# Patient Record
Sex: Female | Born: 1980 | Race: White | Hispanic: No | Marital: Single | Smoking: Current every day smoker
Health system: Southern US, Community
[De-identification: ages and names within clinical notes are randomized; demographics above are authoritative.]

## PROBLEM LIST (undated history)

## (undated) DIAGNOSIS — O139 Gestational [pregnancy-induced] hypertension without significant proteinuria, unspecified trimester: Secondary | ICD-10-CM

## (undated) DIAGNOSIS — M549 Dorsalgia, unspecified: Secondary | ICD-10-CM

## (undated) DIAGNOSIS — I2699 Other pulmonary embolism without acute cor pulmonale: Secondary | ICD-10-CM

## (undated) DIAGNOSIS — F419 Anxiety disorder, unspecified: Secondary | ICD-10-CM

## (undated) DIAGNOSIS — D219 Benign neoplasm of connective and other soft tissue, unspecified: Secondary | ICD-10-CM

## (undated) HISTORY — PX: DILATION AND CURETTAGE OF UTERUS: SHX78

## (undated) HISTORY — PX: TONSILLECTOMY: SUR1361

## (undated) HISTORY — DX: Gestational (pregnancy-induced) hypertension without significant proteinuria, unspecified trimester: O13.9

## (undated) HISTORY — DX: Benign neoplasm of connective and other soft tissue, unspecified: D21.9

---
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
## 2001-08-07 ENCOUNTER — Other Ambulatory Visit: Admission: RE | Admit: 2001-08-07 | Discharge: 2001-08-07 | Payer: Self-pay | Admitting: Gynecology

## 2002-02-24 ENCOUNTER — Emergency Department (HOSPITAL_COMMUNITY): Admission: EM | Admit: 2002-02-24 | Discharge: 2002-02-24 | Payer: Self-pay

## 2002-02-24 ENCOUNTER — Encounter: Payer: Self-pay | Admitting: Emergency Medicine

## 2002-03-12 ENCOUNTER — Encounter: Admission: RE | Admit: 2002-03-12 | Discharge: 2002-03-12 | Payer: Self-pay | Admitting: Gynecology

## 2002-03-12 ENCOUNTER — Encounter: Payer: Self-pay | Admitting: Gynecology

## 2002-04-13 ENCOUNTER — Other Ambulatory Visit: Admission: RE | Admit: 2002-04-13 | Discharge: 2002-04-13 | Payer: Self-pay | Admitting: Gynecology

## 2002-04-24 ENCOUNTER — Encounter (INDEPENDENT_AMBULATORY_CARE_PROVIDER_SITE_OTHER): Payer: Self-pay

## 2002-04-24 ENCOUNTER — Ambulatory Visit (HOSPITAL_COMMUNITY): Admission: AD | Admit: 2002-04-24 | Discharge: 2002-04-24 | Payer: Self-pay | Admitting: Gynecology

## 2002-09-14 ENCOUNTER — Other Ambulatory Visit: Admission: RE | Admit: 2002-09-14 | Discharge: 2002-09-14 | Payer: Self-pay | Admitting: Gynecology

## 2004-12-19 ENCOUNTER — Other Ambulatory Visit: Admission: RE | Admit: 2004-12-19 | Discharge: 2004-12-19 | Payer: Self-pay | Admitting: Gynecology

## 2005-05-27 ENCOUNTER — Inpatient Hospital Stay (HOSPITAL_COMMUNITY): Admission: AD | Admit: 2005-05-27 | Discharge: 2005-05-27 | Payer: Self-pay | Admitting: Gynecology

## 2005-07-11 ENCOUNTER — Inpatient Hospital Stay (HOSPITAL_COMMUNITY): Admission: AD | Admit: 2005-07-11 | Discharge: 2005-07-14 | Payer: Self-pay | Admitting: Gynecology

## 2005-08-27 ENCOUNTER — Other Ambulatory Visit: Admission: RE | Admit: 2005-08-27 | Discharge: 2005-08-27 | Payer: Self-pay | Admitting: Gynecology

## 2005-11-06 ENCOUNTER — Ambulatory Visit: Payer: Self-pay | Admitting: Internal Medicine

## 2008-07-30 ENCOUNTER — Emergency Department (HOSPITAL_COMMUNITY): Admission: EM | Admit: 2008-07-30 | Discharge: 2008-07-30 | Payer: Self-pay | Admitting: Emergency Medicine

## 2008-11-02 ENCOUNTER — Other Ambulatory Visit: Admission: RE | Admit: 2008-11-02 | Discharge: 2008-11-02 | Payer: Self-pay | Admitting: Gynecology

## 2008-11-02 ENCOUNTER — Encounter: Payer: Self-pay | Admitting: Gynecology

## 2008-11-02 ENCOUNTER — Ambulatory Visit: Payer: Self-pay | Admitting: Gynecology

## 2008-11-12 ENCOUNTER — Ambulatory Visit: Payer: Self-pay | Admitting: Gynecology

## 2009-01-03 ENCOUNTER — Ambulatory Visit: Payer: Self-pay | Admitting: Gynecology

## 2009-01-07 ENCOUNTER — Ambulatory Visit: Payer: Self-pay | Admitting: Gynecology

## 2009-01-13 ENCOUNTER — Ambulatory Visit: Payer: Self-pay | Admitting: Gynecology

## 2009-01-24 ENCOUNTER — Ambulatory Visit: Payer: Self-pay | Admitting: Gynecology

## 2009-01-25 ENCOUNTER — Encounter: Payer: Self-pay | Admitting: Gynecology

## 2009-01-25 ENCOUNTER — Ambulatory Visit (HOSPITAL_BASED_OUTPATIENT_CLINIC_OR_DEPARTMENT_OTHER): Admission: RE | Admit: 2009-01-25 | Discharge: 2009-01-25 | Payer: Self-pay | Admitting: Gynecology

## 2009-05-20 ENCOUNTER — Ambulatory Visit: Payer: Self-pay | Admitting: Gynecology

## 2009-05-21 ENCOUNTER — Encounter: Payer: Self-pay | Admitting: Gynecology

## 2009-05-21 ENCOUNTER — Inpatient Hospital Stay (HOSPITAL_COMMUNITY): Admission: AD | Admit: 2009-05-21 | Discharge: 2009-05-21 | Payer: Self-pay | Admitting: Gynecology

## 2009-05-23 ENCOUNTER — Ambulatory Visit: Payer: Self-pay | Admitting: Gynecology

## 2010-05-12 ENCOUNTER — Emergency Department (HOSPITAL_COMMUNITY): Admission: EM | Admit: 2010-05-12 | Discharge: 2010-05-12 | Payer: Self-pay | Admitting: Emergency Medicine

## 2011-02-23 ENCOUNTER — Ambulatory Visit (INDEPENDENT_AMBULATORY_CARE_PROVIDER_SITE_OTHER): Payer: Medicaid Other | Admitting: Gynecology

## 2011-02-23 DIAGNOSIS — N912 Amenorrhea, unspecified: Secondary | ICD-10-CM

## 2011-03-02 ENCOUNTER — Other Ambulatory Visit: Payer: Medicaid Other

## 2011-03-02 ENCOUNTER — Ambulatory Visit (INDEPENDENT_AMBULATORY_CARE_PROVIDER_SITE_OTHER): Payer: Medicaid Other | Admitting: Gynecology

## 2011-03-02 DIAGNOSIS — N912 Amenorrhea, unspecified: Secondary | ICD-10-CM

## 2011-03-02 DIAGNOSIS — O26849 Uterine size-date discrepancy, unspecified trimester: Secondary | ICD-10-CM

## 2011-03-20 LAB — ANTIBODY SCREEN: Antibody Screen: NEGATIVE

## 2011-03-20 LAB — RUBELLA ANTIBODY, IGM: Rubella: IMMUNE

## 2011-03-20 LAB — RPR: RPR: NONREACTIVE

## 2011-03-20 LAB — ABO/RH: RH Type: POSITIVE

## 2011-03-20 LAB — HIV ANTIBODY (ROUTINE TESTING W REFLEX): HIV: NONREACTIVE

## 2011-04-16 LAB — POCT HEMOGLOBIN-HEMACUE: Hemoglobin: 13.5 g/dL (ref 12.0–15.0)

## 2011-05-15 NOTE — Op Note (Signed)
Peggy, [name]             ACCOUNT NO.:  [id number]   MEDICAL RECORD NO.:  [id number]          PATIENT TYPE:  AMB   LOCATION:  [facility]                         FACILITY:  [facility]   PHYSICIAN:  Juan H. Lily Peer, M.D.DATE OF BIRTH:  [birth year]   DATE OF PROCEDURE:  DATE OF DISCHARGE:                               OPERATIVE REPORT   INDICATIONS FOR OPERATION:  A 30 year old gravida 3, para 1, now AB 2  with a first trimester missed AB.  She had been following in the office  with serial ultrasound and quantitative beta hCG.  She had appropriate  rise in a quantitative beta hCG, but ultrasound demonstrated gestational  sac with no evidence of cardiac activity and a subchorionic hematoma had  been noted.   PREOPERATIVE DIAGNOSIS:  First trimester missed abortion.   POSTOPERATIVE DIAGNOSIS:  First trimester missed abortion.   PROCEDURE PERFORMED:  Dilatation and evacuation.   ANESTHESIA:  General endotracheal anesthesia.   FINDINGS:  Products of conception.   DESCRIPTION OF OPERATION:  After the patient was adequately counseled,  she was taken to the operating room.  She underwent a successful general  endotracheal anesthesia.  The vagina and perineum were prepped and  draped in the usual sterile fashion.  A Red Rubber Roxan Hockey had been  inserted to evacuate the bladder of its contents were approximately 50  cc.  The single-tooth tenaculum was placed on the anterior cervical lip.  The uterus sounded to approximately 8 cm and required minimal dilatation  with Shawnie Pons dilators to facilitate insertion of 10 mm suction curette.  This was introduced into the intrauterine cavity and the uterine cavity  was evacuated with suction unit followed by Hunter curette to completely  evacuate the intrauterine cavity of its products of conception present.  Uterotonic agent, Methergine 0.2 mg IM was administered and Tazocin was  administered and 500 cc of lactated Ringer's.  The single-tooth  tenaculum was removed.  The patient tolerated procedure well, was  extubated and transferred to recovery with stable vital signs.  She did  receive a gram of Cefotan for prophylaxis.  Blood loss was approximately  75 cc and less.  IV fluid 800 cc of lactated Ringer's.  Her blood type  is O+.      Juan H. Lily Peer, M.D.  Electronically Signed     JHF/MEDQ  D:  01/25/2009  T:  01/25/2009  Job:  [id number]

## 2011-05-15 NOTE — H&P (Signed)
Peggy [name], GLANDON             ACCOUNT NO.:  [id number]   MEDICAL RECORD NO.:  [id number]          PATIENT TYPE:  AMB   LOCATION:  N[facility]                         FACILITY:  [facility]   PHYSICIAN:  Juan H. Lily Peer, M.D.DATE OF BIRTH:  21-Apr-1981   DATE OF ADMISSION:  12/24/2008  DATE OF DISCHARGE:  12/24/2008                              HISTORY & PHYSICAL    The patient is scheduled for surgery on Tuesday, January 26th at 08:15  a.m. at [facility], please have history and physical  available.   CHIEF COMPLAINT:  First trimester missed AB.   HISTORY OF PRESENT ILLNESS:  The patient is a 30 year old gravida 3,  para 1, now AB 2 who has been followed in the office since January 4th.  The patient has had a last menstrual period reported to be in November  17th.  She was found to have a positive pregnancy test, was followed  with quantitative beta hCG's on January 4th with a value of 14,051.  On  January 07, 2009, it went up to 80,679.  She denied any vaginal bleeding  with followup with serial ultrasounds, the size was less than dates,  gestational sac had been seen, but no evidence of cardiac activity.  In  the last ultrasound done today on January 25th, had demonstrated by  ultrasound, she will be 6 weeks and 1 day by last menstrual period, 9  weeks and 6 days.  Uterine pregnancy was seen in the fundus size less  than date.  Fetal pole was seen.  No cardiac activity.  Chorionic  hematoma measured 17 mm x 8 mm x 17 mm was noted.  Cervix was long and  close.  Right ovary was normal.  Left ovary with a corpus luteum cyst.  Faint yolk sac seen and echogenic material was seen floating in the  gestational sac.  The patient will be scheduled to undergo a D&E.   PAST MEDICAL HISTORY:  She denies any allergies.  She had chlamydia  infection in 2002.  She had surgeries consisted tonsillectomy,  adenoidectomy, and wisdom tooth removal.  She has had a D&E in the past.   FAMILY  HISTORY:  Maternal grandmother with an MI at 32 and mother with  hypertension.   PHYSICAL EXAMINATION:  GENERAL:  A well-developed, well-nourished female  in no acute distress.  VITAL SIGNS:  Currently, weighs 246 pounds, 5 feet 9 inches tall, blood  pressure 122/78.  HEENT:  Unremarkable.  NECK:  Supple.  Trachea midline.  No carotid bruits or thyromegaly.  LUNGS:  Clear to auscultation without rhonchi or wheezes.  HEART:  Regular rate and rhythm.  No murmurs or gallop.  BREAST:  Not done.  ABDOMEN:  Soft and nontender.  No rebound or guarding.  PELVIC:  Bartholin, urethra, and Skene's are within normal limits.  VAGINA AND CERVIX:  No gross lesions on inspection.  UTERUS:  Approximately 6 to 8 weeks' size.  No palpable adnexal masses.  RECTAL:  Exam deferred.   ASSESSMENT/:  A 30 year old gravida 2, para 1, now AB 2 with first  trimester missed AB,  is scheduled to undergo a D&E at [facility] on Tuesday, January 26th at 08:15.  The patient's preop  labs today consist of CBC and urinalysis.  We will do antiphospholipid  antibodies for IgG and IgM and lupus anticoagulant.  We will submit a  portion of the products of conception at the time of D&E for karyotype.  The risk and benefits and pros and cons of the procedure were discussed  which included the risk of uterine perforation during instrumentation  and dilatation curettage to the risk of infection and also hemorrhage.  In the event of hemorrhages, she would need blood or blood products.  She is fully aware of 1:100,000 risk of complications from donor to  receiving blood to include anaphylactic reaction, hepatitis, and AIDS.  We also discussed the risk of infection.  She will receive prophylaxis  antibiotic and the risk for deep vein thrombosis and pulmonary embolism.  We will place PSA stockings as well; and she is a smoker as well.  All  these issues were discussed with the patient.  All questions were   answered, and we will follow accordingly.   PLAN:  The patient is scheduled for D&E on Tuesday, January 26 at 08:15  a.m. at [facility], please have history and physical  available.      Juan H. Lily Peer, M.D.  Electronically Signed     JHF/MEDQ  D:  01/24/2009  T:  01/25/2009  Job:  [id number]

## 2011-05-18 NOTE — H&P (Signed)
Peggy, [name]             ACCOUNT NO.:  [id number]   MEDICAL RECORD NO.:  [id number]          PATIENT TYPE:  INP   LOCATION:  9171                          FACILITY:  [facility]   PHYSICIAN:  Juan H. Lily Peer, M.D.DATE OF BIRTH:  May 28, 1981   DATE OF ADMISSION:  07/11/2005  DATE OF DISCHARGE:                                HISTORY & PHYSICAL   CHIEF COMPLAINT:  Contractions.   HISTORY:  Patient is a 30 year old gravida 2, para 0, AB1 with an estimated  date of confinement July 12, 2005 currently [redacted] weeks gestation.  Was seen in  the office yesterday at [facility] for routine prenatal visit.  Her cervix was found to be 2 cm dilated, about 80% and -3 station.  She  presented late last evening complaining of contractions and was contracting  every five to six minutes apart and cervix was essentially the same.  She  was admitted.  This morning cervix was found to be 4 cm dilated, 90%  effaced, -2 station with a reassuring fetal heart rate tracing with episodes  of heart rate ranging from the 140s-160s.  Patient's temperature this  morning was 98.7, blood pressure 106/68.  Review of her prenatal record  demonstrates a benign prenatal course with the exception of iron deficiency  anemia.   PAST MEDICAL HISTORY:  Patient denies any allergies.  She had one  spontaneous AB in the past and no other medical problems reported.   REVIEW OF SYSTEMS:  See [place] form.   PHYSICAL EXAMINATION:  GENERAL:  Well-developed, well-nourished female.  HEENT:  Unremarkable.  NECK:  Supple.  Trachea midline.  No carotid bruits.  No thyromegaly.  LUNGS:  Clear to auscultation without rhonchi or wheezes.  HEART:  Regular rate and rhythm.  No murmurs or gallops.  BREASTS:  Not done.  ABDOMEN:  Gravid uterus.  Vertex presentation.  Probable Leopold maneuver.  PELVIC:  Cervix was 4+ cm dilated, 90% effaced, -2 station.  Intact  membranes.  EXTREMITIES:  DTRs 1+.  Negative clonus.   PRENATAL  LABORATORIES:  O+ blood type.  Negative antibody screen.  VDRL was  nonreactive.  Rubella immune.  Hepatitis B surface antigen, HIV were  negative.  Alpha fetoprotein was normal.  Diabetes screen was normal.  Pap  smear was normal.  GBS culture was negative.   ASSESSMENT:  A 30 year old gravida 2, para 0, AB1 in active labor with  advanced cervical dilatation at 4 cm, 90% effaced, -2 station.  Underwent  artificial rupture of membranes with clear amniotic fluid.  Clear amniotic  fluid was present.  Fetal scalp electrode and IUPC were placed.  Patient's  contraction has now been more scattered every  four to six minutes apart.  Will wait to see after an hour after rupture of  membranes if her contractions become closer apart.  If not, will go ahead  and augment with Pitocin.   PLAN:  As per assessment above.  Anticipate vaginal delivery.       JHF/MEDQ  D:  07/12/2005  T:  07/12/2005  Job:  [id number]

## 2011-05-18 NOTE — Op Note (Signed)
[facility]  Patient:    JOYCELINE, MAIORINO Visit Number: [id number] MRN: [id number]          Service Type: DSU Location: [facility] Attending Physician:  Tonye Royalty Dictated by:   Gaetano Hawthorne. Lily Peer, M.D. Proc. Date: 04/24/02 Admit Date:  04/24/2002 Discharge Date: 04/24/2002                             Operative Report  PREOPERATIVE DIAGNOSIS:       First trimester missed abortion.  POSTOPERATIVE DIAGNOSIS:      First trimester missed abortion.  OPERATION:                    D&E.  SURGEON:                      Juan H. Lily Peer, M.D.  ANESTHESIA:                   Paracervical block with 2% Xylocaine with                               1:100,000 epinephrine for a total of 10 cc along                               with MAC anesthesia.  BLOOD TYPE:                   O-positive.  COMPLICATIONS:                None.  INDICATIONS FOR PROCEDURE:    A 30 year old gravida 1, para 0, with first trimester missed AB.  DESCRIPTION OF PROCEDURE:     After the patient was adequately counseled, she was taken to the operating room where she underwent a successful MAC anesthesia.  She was in the low lithotomy position.  The bladder was evacuated of its contents with a red rubber Roxan Hockey for approximately 25 cc.  A speculum was placed in the vaginal vault after a bimanual examination demonstrated the uterus measured 8-10 weeks size with no palpable adnexal masses.  Two percent Xylocaine with 1:100,000 epinephrine was infiltrated into the cervical stroma at the 2, 4, 8, and 10 oclock positions.  The cervix was serially dilated to a size 22 Pratt dilator and a 10 mm suction curet was inserted into the uterine cavity after a single tooth tenaculum was placed into the anterior cervical lip for manipulation during the D&E.  A moderate amount of tissue was obtained and this was interchanged.  The suction curet was interchanged with the serrated curet  to remove additional products of conception to completely evacuate the intrauterine cavity.  The patient did receive 1 g of Cefotan for prophylaxis.  The single tooth tenaculum was removed.  She was given 10 units of Pitocin and 500 cc of lactated Ringers and was transferred to the recovery room with stable vital signs and was given 30 mg of Toradol IV for postoperative analgesia.  The patient tolerated the procedure well.  There were no complications.  IV fluids were 500 cc of lactated Ringers. Dictated by:   Gaetano Hawthorne Lily Peer, M.D. Attending Physician:  Tonye Royalty DD:  04/24/02 TD:  04/25/02 Job: [id number]

## 2011-05-18 NOTE — H&P (Signed)
[facility]  Patient:    Peggy [name], Peggy [name] Visit Number: [id number] MRN: [id number]          Service Type: DSU Location: [facility] Attending Physician:  Tonye Royalty Dictated by:   Gaetano Hawthorne. Lily Peer, M.D. Admit Date:  04/24/2002 Discharge Date: 04/24/2002                           History and Physical  Patient is scheduled for surgery this afternoon at [facility] at 5 p.m.  Please have history and physical available.  CHIEF COMPLAINT:              Vaginal bleeding.  HISTORY OF PRESENT ILLNESS:   The patient is a 30 year old gravida 1, para 0, with a last menstrual period February 06, 2002.  Estimated date of confinement November 13, 2002, currently at [redacted] weeks gestation, was seen at [facility] last evening, complaining of vaginal bleeding and passage of blood clots.  Ultrasound had demonstrated no evidence of cardiac activity and patient was instructed to follow up in the office today.  She continued to have vaginal bleeding and passage of a lot of blood clot.  Repeat ultrasound in the office today demonstrated intrauterine pregnancy of 8 week and 5 days and by last menstrual period, she would have been 11 weeks.  The ultrasound had demonstrated an elongated sac which was intact.  No embryonic heart motion was noted, and some scalp edema, normal ovary ______ bilateral.  Findings consistent with a missed AB.  The patients blood type is O positive and will be taken to the operating room this afternoon for a D&E.  PAST MEDICAL HISTORY:         She was seen for her first OB appointment on April 11, 2002, with no risk factors identified.  She denies any allergies. She has had history of tonsillectomy and adenoidectomy and wisdom tooth removal.  PHYSICAL EXAMINATION:  VITAL SIGNS:                  Blood pressure 110/76, weight 152.5 pounds.  HEENT:                        Unremarkable.  NECK:                          Supple.  Trachea midline, no carotid bruits, no thyromegaly.  LUNGS:                        Clear to auscultation without rhonchi or wheezes.  HEART:                        Regular rate and rhythm, no murmurs or gallops.  BREAST:                       Not done.  ABDOMEN:                      Soft, nontender, without rebound or guarding.  PELVIC:                       Bartholin urethral, Skene within normal limits. Vagina and cervix with no unusual discharge.  Uterus 8-10 weeks size.  Blood in her vault.  Cervix dilated 1 cm.  EXTREMITIES:                  DTR 1+, negative clonus.  PRENATAL LABORATORY DATA:     O positive blood type, negative antibody screen, VDRL nonreactive, rubella immune, hepatitis B surface antigen and HIV were both negative.  The patients CBC today demonstrated a hemoglobin and hematocrit of 11.7 and 34.5, respectively.  Platelet count 271,000.  ASSESSMENT:                   A 30 year old gravida 1, para 0, 11 weeks by last menstrual period, 8-1/2 weeks by ultrasound and with evidence of a missed abortion.  Will be taken to the operating room this afternoon for a D&E. Risks, benefits, pros, and cons to include infection, bleeding, trauma to internal organs, and perforation of the uterus were discussed.  She will receive prophylactic antibiotics in the event of perforation of the uterus. The patient is fully aware that she may end up having an open laparotomy in taking care of the trauma that may have occurred.  Also the event of blood transfusion.  Patient fully aware of risk such as anaphylactic reaction, hepatitis, and acquired immunodeficiency syndrome.  All of these issues were discussed with the patient.  All questions were answered and will follow accordingly.  Of note, her Pap smear had demonstrated atypical squamous cells of undetermined significance, and we will have a repeat follow-up Pap smear in three months. Dictated by:   Gaetano Hawthorne Lily Peer,  M.D. Attending Physician:  Tonye Royalty DD:  04/24/02 TD:  04/24/02 Job: [id number]

## 2011-07-11 ENCOUNTER — Ambulatory Visit (INDEPENDENT_AMBULATORY_CARE_PROVIDER_SITE_OTHER): Payer: Self-pay | Admitting: Surgery

## 2011-08-08 ENCOUNTER — Other Ambulatory Visit (HOSPITAL_COMMUNITY): Payer: Self-pay | Admitting: Obstetrics and Gynecology

## 2011-08-08 ENCOUNTER — Encounter (HOSPITAL_COMMUNITY): Payer: Self-pay

## 2011-08-08 ENCOUNTER — Inpatient Hospital Stay (HOSPITAL_COMMUNITY)
Admission: AD | Admit: 2011-08-08 | Discharge: 2011-08-08 | Disposition: A | Discharge status: Home / Self Care | Payer: Medicaid Other | Source: Ambulatory Visit | Attending: Obstetrics and Gynecology | Admitting: Obstetrics and Gynecology

## 2011-08-08 DIAGNOSIS — O4700 False labor before 37 completed weeks of gestation, unspecified trimester: Secondary | ICD-10-CM | POA: Insufficient documentation

## 2011-08-08 DIAGNOSIS — O30009 Twin pregnancy, unspecified number of placenta and unspecified number of amniotic sacs, unspecified trimester: Secondary | ICD-10-CM | POA: Insufficient documentation

## 2011-08-08 MED ORDER — TRAMADOL HCL 50 MG PO TABS
50.0000 mg | ORAL_TABLET | Freq: Four times a day (QID) | ORAL | Status: AC | PRN
  Administered 2011-08-08: 50 mg via ORAL
  Filled 2011-08-08: qty 1

## 2011-08-08 MED ORDER — NIFEDIPINE 10 MG PO CAPS
10.0000 mg | ORAL_CAPSULE | Freq: Once | ORAL | Status: AC
  Administered 2011-08-08: 10 mg via ORAL
  Filled 2011-08-08: qty 1

## 2011-08-08 MED ORDER — TRAMADOL HCL 50 MG PO TABS: 50.0000 mg | ORAL_TABLET | Freq: Four times a day (QID) | ORAL | Status: DC | PRN

## 2011-08-08 MED ORDER — NIFEDIPINE ER OSMOTIC RELEASE 30 MG PO TB24: 30.0000 mg | ORAL_TABLET | Freq: Four times a day (QID) | ORAL | Status: DC

## 2011-08-08 NOTE — ED Provider Notes (Signed)
S: Pt states she only feels occ ctx. Desires to go home  O: VSS,  FHR A =140 cat 1 FHR B=150 cat 1 UC's 2-5 FFN=neg VE deferred  A: 32.1 wks  Twins Pre-term ctx continued after 3 doses of 10mg   procardia discordinate growth Back pain  P: D/W Dr Su Hilt, recommendation would be for patient to stay overnight and f/u with MFM as scheduled tomorrow. Pt declines to stay. RX for procardia 30mg  PO q 6hours, x2 days, may take as early as 4 hours RX for tramadol 40mg  q6 hours PRN for back pain F/U MFM as sched tmrw Return to MAU if ctx increase, cramping, heavy vaginal bleeding, decreased fetal movement or LOF.  Pt D/C home in stable condition.  Dr. Su Hilt aware.   S.Jaqua Ching, CNM

## 2011-08-08 NOTE — Progress Notes (Signed)
Contractions today in the office was sent for further monitoring

## 2011-08-08 NOTE — Progress Notes (Signed)
Patient states she has been taking Percocet for back pain and has now stopped taking. States Dr. Stefano Gaul suggested she try Tramadol and is expecting prescription today. Pt. States she has been in pain the entire pregnancy.

## 2011-08-08 NOTE — ED Notes (Signed)
Manfred Arch, CNM called to inquire of pt. status. Informed of cerv. exam. Other orders completed and results pending. Provider reviewing tracing remotely.

## 2011-08-08 NOTE — ED Provider Notes (Signed)
History   Presented from office after ultrasound for twins showing oligo and discordant growth of female twin.  Had NST and office that also showed frequent contractions.  Sent for further monitoring. Pregnancy remarkable for: Twins Hx SAB X3 Fibroids Hx. Gestational HTN Smoker Chronic back and groin pain--referred to PT, on Percocet/Vicodin, but stopped "cold [place]" about 3 weeks ago.  Per Dr. Stefano Gaul, OK for Tramadol Oligo/discordant growth Twin A (female)  Chief Complaint  Patient presents with  . Contractions     OB History    Grav Para Term Preterm Abortions TAB SAB Ect Mult Living   5 1 1  0 3 0 3 0 0 1      Past Medical History  Diagnosis Date  . No pertinent past medical history     Past Surgical History  Procedure Date  . Dilation and curettage of uterus   . Tonsillectomy     No family history on file.  History  Substance Use Topics  . Smoking status: Current Everyday Smoker -- 0.2 packs/day  . Smokeless tobacco: Never Used  . Alcohol Use: No    Allergies: No Known Allergies  Prescriptions prior to admission  Medication Sig Dispense Refill  . HYDROcodone-acetaminophen (VICODIN) 5-500 MG per tablet Take 1 tablet by mouth 2 (two) times daily as needed. For pain       . prenatal vitamin w/FE, FA (PRENATAL 1 + 1) 27-1 MG TABS Take 1 tablet by mouth daily.           Physical Exam   Blood pressure 120/65, pulse 100, temperature 98 F (36.7 C), temperature source Oral, resp. rate 16, height 5\' 10"  (1.778 m), weight 99.338 kg (219 lb).    ED Course  Cervix FT, long by Diamantina Providence, RN with FFN done before exam. GC, Chlamydia sent on dirty urine GBS done.  FHRs reactive x 2. UCs q 3 min, mild.  Plan: Consulted with Dr. Su Hilt. Will give 3 sequential doses of Procardia for PTL control.  If no diminishing of UCs, anticipate admission to Antenatal. Await FFN results Patient has MFM appointment tomorrow for consult at 12p for oligohydramnios and  discordant growth of twins.  Nigel Bridgeman, CNM, [place]

## 2011-08-09 ENCOUNTER — Inpatient Hospital Stay (HOSPITAL_COMMUNITY): Admission: RE | Admit: 2011-08-09 | Payer: Medicaid Other | Source: Ambulatory Visit

## 2011-08-09 ENCOUNTER — Encounter (HOSPITAL_COMMUNITY): Payer: Self-pay

## 2011-08-09 ENCOUNTER — Ambulatory Visit (HOSPITAL_COMMUNITY)
Admission: RE | Admit: 2011-08-09 | Discharge: 2011-08-09 | Disposition: A | Discharge status: Home / Self Care | Payer: Medicaid Other | Source: Ambulatory Visit | Attending: Obstetrics and Gynecology | Admitting: Obstetrics and Gynecology

## 2011-08-09 DIAGNOSIS — O30009 Twin pregnancy, unspecified number of placenta and unspecified number of amniotic sacs, unspecified trimester: Secondary | ICD-10-CM | POA: Insufficient documentation

## 2011-08-09 DIAGNOSIS — O26849 Uterine size-date discrepancy, unspecified trimester: Secondary | ICD-10-CM | POA: Insufficient documentation

## 2011-08-09 LAB — GC/CHLAMYDIA PROBE AMP, URINE: Chlamydia, Swab/Urine, PCR: NEGATIVE

## 2011-08-09 NOTE — Progress Notes (Signed)
Report in [facility]EP[facility]; follow-up as needed 

## 2011-08-12 LAB — CULTURE, BETA STREP (GROUP B ONLY)

## 2011-08-28 ENCOUNTER — Other Ambulatory Visit (HOSPITAL_COMMUNITY): Payer: Self-pay | Admitting: Obstetrics and Gynecology

## 2011-08-30 ENCOUNTER — Ambulatory Visit (HOSPITAL_COMMUNITY)
Admission: RE | Admit: 2011-08-30 | Discharge: 2011-08-30 | Disposition: A | Discharge status: Home / Self Care | Payer: Medicaid Other | Source: Ambulatory Visit | Attending: Obstetrics and Gynecology | Admitting: Obstetrics and Gynecology

## 2011-08-30 DIAGNOSIS — O30009 Twin pregnancy, unspecified number of placenta and unspecified number of amniotic sacs, unspecified trimester: Secondary | ICD-10-CM | POA: Insufficient documentation

## 2011-08-30 DIAGNOSIS — O26849 Uterine size-date discrepancy, unspecified trimester: Secondary | ICD-10-CM | POA: Insufficient documentation

## 2011-09-07 ENCOUNTER — Inpatient Hospital Stay (HOSPITAL_COMMUNITY)
Admission: AD | Admit: 2011-09-07 | Discharge: 2011-09-08 | DRG: 778 | Disposition: A | Discharge status: Home / Self Care | Payer: Medicaid Other | Source: Ambulatory Visit | Attending: Obstetrics and Gynecology | Admitting: Obstetrics and Gynecology

## 2011-09-07 ENCOUNTER — Inpatient Hospital Stay (HOSPITAL_COMMUNITY): Payer: Medicaid Other

## 2011-09-07 DIAGNOSIS — O4700 False labor before 37 completed weeks of gestation, unspecified trimester: Principal | ICD-10-CM | POA: Diagnosis present

## 2011-09-07 DIAGNOSIS — O30009 Twin pregnancy, unspecified number of placenta and unspecified number of amniotic sacs, unspecified trimester: Secondary | ICD-10-CM | POA: Diagnosis present

## 2011-09-07 LAB — CBC
Hemoglobin: 12.1 g/dL (ref 12.0–15.0)
MCV: 91.6 fL (ref 78.0–100.0)
Platelets: 229 10*3/uL (ref 150–400)
RBC: 3.82 MIL/uL — ABNORMAL LOW (ref 3.87–5.11)
WBC: 12.7 10*3/uL — ABNORMAL HIGH (ref 4.0–10.5)

## 2011-09-07 LAB — RPR: RPR Ser Ql: NONREACTIVE

## 2011-09-07 MED ORDER — IBUPROFEN 600 MG PO TABS: 600.0000 mg | ORAL_TABLET | Freq: Four times a day (QID) | ORAL | Status: DC | PRN

## 2011-09-07 MED ORDER — ACETAMINOPHEN 325 MG PO TABS: 650.0000 mg | ORAL_TABLET | ORAL | Status: DC | PRN

## 2011-09-07 MED ORDER — LACTATED RINGERS IV SOLN
INTRAVENOUS | Status: DC
  Administered 2011-09-07: 125 mL/h via INTRAVENOUS
  Administered 2011-09-07 – 2011-09-08 (×2): via INTRAVENOUS

## 2011-09-07 MED ORDER — OXYTOCIN 20 UNITS IN LACTATED RINGERS INFUSION - SIMPLE: 125.0000 mL/h | Freq: Once | INTRAVENOUS | Status: DC

## 2011-09-07 MED ORDER — ZOLPIDEM TARTRATE 10 MG PO TABS
10.0000 mg | ORAL_TABLET | Freq: Every evening | ORAL | Status: DC | PRN
  Administered 2011-09-07: 10 mg via ORAL
  Filled 2011-09-07 (×2): qty 1

## 2011-09-07 MED ORDER — LIDOCAINE HCL (PF) 1 % IJ SOLN: 30.0000 mL | INTRAMUSCULAR | Status: DC | PRN

## 2011-09-07 MED ORDER — FLEET ENEMA 7-19 GM/118ML RE ENEM: 1.0000 | ENEMA | RECTAL | Status: DC | PRN

## 2011-09-07 MED ORDER — CITRIC ACID-SODIUM CITRATE 334-500 MG/5ML PO SOLN: 30.0000 mL | ORAL | Status: DC | PRN

## 2011-09-07 MED ORDER — BUTORPHANOL TARTRATE 2 MG/ML IJ SOLN
INTRAMUSCULAR | Status: AC
  Administered 2011-09-07: 2 mg via INTRAVENOUS
  Filled 2011-09-07: qty 1

## 2011-09-07 MED ORDER — LACTATED RINGERS IV SOLN: 500.0000 mL | INTRAVENOUS | Status: DC | PRN

## 2011-09-07 MED ORDER — BUTORPHANOL TARTRATE 2 MG/ML IJ SOLN
2.0000 mg | INTRAMUSCULAR | Status: DC | PRN
  Administered 2011-09-07 – 2011-09-08 (×5): 2 mg via INTRAVENOUS
  Filled 2011-09-07 (×5): qty 1

## 2011-09-07 MED ORDER — NICOTINE 7 MG/24HR TD PT24
7.0000 mg | MEDICATED_PATCH | Freq: Every day | TRANSDERMAL | Status: DC
  Administered 2011-09-07: 7 mg via TRANSDERMAL
  Filled 2011-09-07 (×2): qty 1

## 2011-09-07 MED ORDER — OXYTOCIN BOLUS FROM INFUSION
500.0000 mL | Freq: Once | INTRAVENOUS | Status: DC
  Filled 2011-09-07: qty 500

## 2011-09-07 MED ORDER — OXYCODONE-ACETAMINOPHEN 5-325 MG PO TABS: 2.0000 | ORAL_TABLET | ORAL | Status: DC | PRN

## 2011-09-07 MED ORDER — ONDANSETRON HCL 4 MG/2ML IJ SOLN: 4.0000 mg | Freq: Four times a day (QID) | INTRAMUSCULAR | Status: DC | PRN

## 2011-09-07 NOTE — H&P (Signed)
Peggy [name] is a 30 y.o. female presenting for contractions from the office with exam of 3-4/80/-1.  Good FM and no LOF or bleeding.  Maternal Medical History:  Reason for admission: Reason for admission: contractions.  Contractions: Frequency: regular.   Perceived severity is moderate.      OB History    Grav Para Term Preterm Abortions TAB SAB Ect Mult Living   5 1 1  0 3 0 3 0 0 1     Past Medical History  Diagnosis Date  . No pertinent past medical history    Past Surgical History  Procedure Date  . Dilation and curettage of uterus   . Tonsillectomy    Family History: family history is not on file. Social History:  reports that she has been smoking.  She has never used smokeless tobacco. She reports that she does not drink alcohol or use illicit drugs.  Review of Systems  Constitutional: Negative.   HENT: Negative.   Eyes: Negative.   Respiratory: Negative.   Cardiovascular: Negative.   Gastrointestinal: Negative.   Genitourinary: Negative.   Musculoskeletal: Negative.   Skin: Negative.   Neurological: Negative.       Dilation: 3 Effacement (%): 70 Station: -2 Exam by:: Dr Su Hilt Blood pressure 117/66, pulse 106, temperature 98.2 F (36.8 C), temperature source Oral, resp. rate 18, height 5\' 10"  (1.778 m), weight 102.513 kg (226 lb). Maternal Exam:  Uterine Assessment: Contraction strength is moderate.  Contraction frequency is regular.   Abdomen: Patient reports no abdominal tenderness. Fetal presentation: vertex  Introitus: Normal vulva.   Physical Exam  Constitutional: She is oriented to person, place, and time. She appears well-developed and well-nourished.  HENT:  Head: Normocephalic.  Neck: Normal range of motion. Neck supple.  Cardiovascular: Normal rate and regular rhythm.   Respiratory: Effort normal and breath sounds normal.  GI: Soft. Bowel sounds are normal.  Genitourinary: Vagina normal and uterus normal.  Musculoskeletal: Normal  range of motion.  Neurological: She is alert and oriented to person, place, and time. She has normal reflexes.  Skin: Skin is warm.    Bedside U/S: Vtx/Vtx  Prenatal labs: ABO, Rh:   Antibody:   Rubella:   RPR:    HBsAg:    HIV:    GBS:     Assessment/Plan: 29yo G5P1 at 61 3/7wks admitted for regular painful contractions and eval for PTL with Twins.  Will expectantly manage and get u/s for confirmation of presentation.  IV pain medicine upon request.  Purcell Nails 09/07/2011, 5:03 PM

## 2011-09-08 MED ORDER — ZOLPIDEM TARTRATE 10 MG PO TABS: 10.0000 mg | ORAL_TABLET | Freq: Every evening | ORAL | Status: DC | PRN

## 2011-09-08 MED ORDER — TRAMADOL HCL 50 MG PO TABS: 50.0000 mg | ORAL_TABLET | Freq: Four times a day (QID) | ORAL | Status: DC | PRN

## 2011-09-08 NOTE — Discharge Summary (Signed)
Obstetric Discharge Summary Reason for Admission: observation/evaluation Prenatal Procedures: ultrasound Intrapartum Procedures: not in labor Postpartum Procedures: n/a Complications-Operative and Postpartum: n/a Hemoglobin  Date Value Range Status  09/07/2011 12.1  12.0-15.0 (g/dL) Final     HCT  Date Value Range Status  09/07/2011 35.0* 36.0-46.0 (%) Final    Discharge Diagnoses: False labor-undelivered  Discharge Information: Date: 09/08/2011 Activity: as tolerated Diet: routine Medications: tramadol, ambien, pnv Condition: stable Instructions: d/c instructions Discharge to: home Follow-up Information    Follow up with Purcell Nails, MD on 09/10/2011.   Contact information:   [address] [place] [address] [phone]          Newborn Data: This patient has no babies on file. Home with n/a.  Purcell Nails 09/08/2011, 10:28 AM

## 2011-09-08 NOTE — Progress Notes (Signed)
Peggy [name] is a 30 y.o. [phone] at [redacted]w[redacted]d admitted for Preterm labor  Subjective: Patient has no complaints.  She says her contractions have spaced and have decreased in intensity.  She denies LOF and reports good FM.  Objective: BP 123/86  Pulse 92  Temp(Src) 97.9 F (36.6 C) (Oral)  Resp 20  Ht 5\' 10"  (1.778 m)  Wt 102.513 kg (226 lb)  BMI 32.43 kg/m2      FHT:  FHR: 120-130s bpm, variability: moderate,  accelerations:  Present,  decelerations:  Absent for both twims UC:   irregular, every 10 minutes and mild SVE:   Dilation: 3 Effacement (%):  (75) Station: -2 Exam by:: Dr. Su Hilt  Labs: Lab Results  Component Value Date   WBC 12.7* 09/07/2011   HGB 12.1 09/07/2011   HCT 35.0* 09/07/2011   MCV 91.6 09/07/2011   PLT 229 09/07/2011    Assessment / Plan: 29yo G5P1 not in labor.  Cervix is unchanged overnight.  Patient anticipates d/c home.  She requests refill of her tramadol and the [place].  D/c instructions reviewed.  She has an appt already scheduled in office on Monday.  Fetal Wellbeing:  Category I on both twins Pain Control:  s/p stadol overnight  Briscoe Daniello Y 09/08/2011, 10:20 AM

## 2011-09-12 ENCOUNTER — Encounter (HOSPITAL_COMMUNITY): Payer: Self-pay | Admitting: *Deleted

## 2011-09-12 ENCOUNTER — Inpatient Hospital Stay (HOSPITAL_COMMUNITY)
Admission: AD | Admit: 2011-09-12 | Discharge: 2011-09-12 | Disposition: A | Discharge status: Home / Self Care | Payer: Medicaid Other | Source: Ambulatory Visit | Attending: Obstetrics and Gynecology | Admitting: Obstetrics and Gynecology

## 2011-09-12 DIAGNOSIS — O139 Gestational [pregnancy-induced] hypertension without significant proteinuria, unspecified trimester: Secondary | ICD-10-CM | POA: Insufficient documentation

## 2011-09-12 DIAGNOSIS — IMO0001 Reserved for inherently not codable concepts without codable children: Secondary | ICD-10-CM

## 2011-09-12 DIAGNOSIS — O30009 Twin pregnancy, unspecified number of placenta and unspecified number of amniotic sacs, unspecified trimester: Secondary | ICD-10-CM | POA: Insufficient documentation

## 2011-09-12 LAB — URINALYSIS, ROUTINE W REFLEX MICROSCOPIC
Bilirubin Urine: NEGATIVE
Ketones, ur: NEGATIVE mg/dL
Nitrite: NEGATIVE
Protein, ur: NEGATIVE mg/dL
Urobilinogen, UA: 0.2 mg/dL (ref 0.0–1.0)

## 2011-09-12 LAB — COMPREHENSIVE METABOLIC PANEL
Albumin: 2.7 g/dL — ABNORMAL LOW (ref 3.5–5.2)
BUN: 6 mg/dL (ref 6–23)
CO2: 21 mEq/L (ref 19–32)
Chloride: 105 mEq/L (ref 96–112)
Creatinine, Ser: 0.56 mg/dL (ref 0.50–1.10)
GFR calc non Af Amer: >60 mL/min (ref >60)
Total Bilirubin: 0.2 mg/dL — ABNORMAL LOW (ref 0.3–1.2)

## 2011-09-12 LAB — CBC
HCT: 33.4 % — ABNORMAL LOW (ref 36.0–46.0)
MCV: 91.5 fL (ref 78.0–100.0)
RDW: 13.8 % (ref 11.5–15.5)
WBC: 9.6 10*3/uL (ref 4.0–10.5)

## 2011-09-12 LAB — URIC ACID: Uric Acid, Serum: 5.9 mg/dL (ref 2.4–7.0)

## 2011-09-12 LAB — LACTATE DEHYDROGENASE: LDH: 179 U/L (ref 94–250)

## 2011-09-12 NOTE — Progress Notes (Signed)
Pt sent over from office for pih eval, pt reports dizziness, lightheaded, and blurred vision. Reports severe headache last night, unable to see.  Denies any bleeding or leaking of fluid.  Reports decreased fetal movement since 1730.

## 2011-09-12 NOTE — Progress Notes (Signed)
Pt, " I was sent from the office because I was 4 cm in the office, I'm swelling and having blurred vision,and having contractions every 3-4 min."

## 2011-09-12 NOTE — ED Provider Notes (Signed)
History   [name] is a [id number] at 37.1 weeks who presents for [facility] w/u from office secondary to BP=118/90 there today.  Pt c/o blurred vision, but no other PIH s/s.  Pregnancy r/f:  1.  Twins  2.  H/o SAB x3  3.  Fibroids  4.  H/o gestational HTN in past  5.  Smoker.  Cx ~4cm at office today by dr. Stefano [name].  Denies UTI s/s; no GI or resp c/o's.  No fever, chills, or malaise.  Regular ctxs for several days, but no cx change.  S.o at bs and supportive.  Reports GFM x2.  Chief Complaint  Patient presents with  . Blurred Vision  . Contractions   HPI  OB History    Grav Para Term Preterm Abortions TAB SAB Ect Mult Living   5 1 1  0 3 0 3 0 0 1      Past Medical History  Diagnosis Date  . No pertinent past medical history     Past Surgical History  Procedure Date  . Dilation and curettage of uterus   . Tonsillectomy     No family history on file.  History  Substance Use Topics  . Smoking status: Current Everyday Smoker -- 0.2 packs/day  . Smokeless tobacco: Never Used  . Alcohol Use: No    Allergies: No Known Allergies  Prescriptions prior to admission  Medication Sig Dispense Refill  . HYDROcodone-acetaminophen (VICODIN) 5-500 MG per tablet Take 1 tablet by mouth every 6 (six) hours as needed.        . prenatal vitamin w/FE, FA (PRENATAL 1 + 1) 27-1 MG TABS Take 1 tablet by mouth daily.        . traMADol (ULTRAM) 50 MG tablet Take 1 tablet (50 mg total) by mouth every 6 (six) hours as needed for pain.  40 tablet  0  . zolpidem (AMBIEN) 10 MG tablet Take 1 tablet (10 mg total) by mouth at bedtime as needed for sleep.  30 tablet  0    ROS:  Negative except blurred vision, LE edema, & contractions Physical Exam   Blood pressure 118/79, pulse 105, temperature 98.7 F (37.1 C), temperature source Oral, height 5\' 9"  (1.753 m), weight 106.255 kg (234 lb 4 oz). BP range in MAU:  118-127/75-87  LABS:  CBC WNL (platelets =192)  CMET WNL (AST=14, ALT=7, uric acid=5.9)  U/a:   WNL except Hazy & SG=1.02  Physical Exam  Gen:  NAD, A&Ox3 Abd: soft, NT Pelvic:  Cx=3.5/80/-1, vtx, intact Ext:  2-3+ pitting edema, BLE; DTR 1+, no clonus  EFM:  A: 140, reactive, no decels, moderate variability  B:  140, reactive, no decels, moderate variability TOCO:  Mod ctxs every 2-4 min  MAU Course  Procedures 1.  PIH labs 2.  Ext monitoring x2 3.  cx check  Assessment and Plan  1.  Twins at 37.1 2.  No cx change despite ctxs 3.  Nml PIH labs and normotensive 4.  Reactive FHT x2 5.  MOPS/READY  1.  D/C home per c/s dr. Su [name] with labor and PIH precautions 2.  F/u Mon at [facility] as scheduled or prn 3.  Support given; disc'd comfort measures  Peggy [name] H 09/12/2011, 10:57 PM

## 2011-09-18 ENCOUNTER — Encounter (HOSPITAL_COMMUNITY): Payer: Self-pay | Admitting: Anesthesiology

## 2011-09-18 ENCOUNTER — Inpatient Hospital Stay (HOSPITAL_COMMUNITY)
Admission: AD | Admit: 2011-09-18 | Discharge: 2011-09-20 | DRG: 775 | Disposition: A | Discharge status: Home / Self Care | Payer: Medicaid Other | Source: Ambulatory Visit | Attending: Obstetrics and Gynecology | Admitting: Obstetrics and Gynecology

## 2011-09-18 ENCOUNTER — Encounter (HOSPITAL_COMMUNITY): Payer: Self-pay

## 2011-09-18 ENCOUNTER — Other Ambulatory Visit: Payer: Self-pay | Admitting: Obstetrics and Gynecology

## 2011-09-18 ENCOUNTER — Inpatient Hospital Stay (HOSPITAL_COMMUNITY): Payer: Medicaid Other | Admitting: Anesthesiology

## 2011-09-18 ENCOUNTER — Inpatient Hospital Stay (HOSPITAL_COMMUNITY): Payer: Medicaid Other

## 2011-09-18 DIAGNOSIS — Z22330 Carrier of Group B streptococcus: Secondary | ICD-10-CM

## 2011-09-18 DIAGNOSIS — D649 Anemia, unspecified: Secondary | ICD-10-CM | POA: Diagnosis not present

## 2011-09-18 DIAGNOSIS — O99892 Other specified diseases and conditions complicating childbirth: Secondary | ICD-10-CM | POA: Diagnosis present

## 2011-09-18 DIAGNOSIS — IMO0001 Reserved for inherently not codable concepts without codable children: Secondary | ICD-10-CM

## 2011-09-18 DIAGNOSIS — O30009 Twin pregnancy, unspecified number of placenta and unspecified number of amniotic sacs, unspecified trimester: Secondary | ICD-10-CM

## 2011-09-18 DIAGNOSIS — O9903 Anemia complicating the puerperium: Secondary | ICD-10-CM | POA: Diagnosis not present

## 2011-09-18 LAB — CBC
Platelets: 211 10*3/uL (ref 150–400)
RBC: 3.87 MIL/uL (ref 3.87–5.11)
RDW: 13.9 % (ref 11.5–15.5)
WBC: 11.6 10*3/uL — ABNORMAL HIGH (ref 4.0–10.5)

## 2011-09-18 LAB — STREP B DNA PROBE: GBS: POSITIVE

## 2011-09-18 MED ORDER — SIMETHICONE 80 MG PO CHEW: 80.0000 mg | CHEWABLE_TABLET | ORAL | Status: DC | PRN

## 2011-09-18 MED ORDER — MEASLES, MUMPS & RUBELLA VAC ~~LOC~~ INJ
0.5000 mL | INJECTION | Freq: Once | SUBCUTANEOUS | Status: DC
  Filled 2011-09-18: qty 0.5

## 2011-09-18 MED ORDER — LACTATED RINGERS IV SOLN
INTRAVENOUS | Status: DC
  Administered 2011-09-18: 15:00:00 via INTRAVENOUS

## 2011-09-18 MED ORDER — EPHEDRINE 5 MG/ML INJ
10.0000 mg | INTRAVENOUS | Status: DC | PRN
  Filled 2011-09-18 (×2): qty 4

## 2011-09-18 MED ORDER — LIDOCAINE HCL (PF) 1 % IJ SOLN
30.0000 mL | INTRAMUSCULAR | Status: DC | PRN
  Filled 2011-09-18 (×2): qty 30

## 2011-09-18 MED ORDER — PHENYLEPHRINE 40 MCG/ML (10ML) SYRINGE FOR IV PUSH (FOR BLOOD PRESSURE SUPPORT)
80.0000 ug | PREFILLED_SYRINGE | INTRAVENOUS | Status: DC | PRN
  Filled 2011-09-18: qty 5

## 2011-09-18 MED ORDER — LACTATED RINGERS IV SOLN
500.0000 mL | Freq: Once | INTRAVENOUS | Status: AC
  Administered 2011-09-18: 1000 mL via INTRAVENOUS

## 2011-09-18 MED ORDER — LACTATED RINGERS IV SOLN: 500.0000 mL | INTRAVENOUS | Status: DC | PRN

## 2011-09-18 MED ORDER — OXYTOCIN 20 UNITS IN LACTATED RINGERS INFUSION - SIMPLE
125.0000 mL/h | Freq: Once | INTRAVENOUS | Status: DC
  Administered 2011-09-18: 999 mL/h via INTRAVENOUS

## 2011-09-18 MED ORDER — IBUPROFEN 600 MG PO TABS
600.0000 mg | ORAL_TABLET | Freq: Four times a day (QID) | ORAL | Status: DC
  Administered 2011-09-19 – 2011-09-20 (×6): 600 mg via ORAL
  Filled 2011-09-18 (×6): qty 1

## 2011-09-18 MED ORDER — ZOLPIDEM TARTRATE 5 MG PO TABS: 5.0000 mg | ORAL_TABLET | Freq: Every evening | ORAL | Status: DC | PRN

## 2011-09-18 MED ORDER — PENICILLIN G POTASSIUM 5000000 UNITS IJ SOLR
2.5000 10*6.[IU] | INTRAMUSCULAR | Status: DC
  Administered 2011-09-18: 2.5 10*6.[IU] via INTRAVENOUS
  Filled 2011-09-18 (×3): qty 2.5

## 2011-09-18 MED ORDER — OXYCODONE-ACETAMINOPHEN 5-325 MG PO TABS
2.0000 | ORAL_TABLET | ORAL | Status: DC | PRN
  Administered 2011-09-18: 2 via ORAL
  Filled 2011-09-18: qty 2

## 2011-09-18 MED ORDER — DIPHENHYDRAMINE HCL 50 MG/ML IJ SOLN: 12.5000 mg | INTRAMUSCULAR | Status: DC | PRN

## 2011-09-18 MED ORDER — TETANUS-DIPHTH-ACELL PERTUSSIS 5-2.5-18.5 LF-MCG/0.5 IM SUSP
0.5000 mL | Freq: Once | INTRAMUSCULAR | Status: AC
  Administered 2011-09-20: 0.5 mL via INTRAMUSCULAR

## 2011-09-18 MED ORDER — ONDANSETRON HCL 4 MG/2ML IJ SOLN: 4.0000 mg | Freq: Four times a day (QID) | INTRAMUSCULAR | Status: DC | PRN

## 2011-09-18 MED ORDER — FENTANYL 2.5 MCG/ML BUPIVACAINE 1/10 % EPIDURAL INFUSION (WH - ANES)
14.0000 mL/h | INTRAMUSCULAR | Status: DC
  Administered 2011-09-18: 14 mL/h via EPIDURAL
  Filled 2011-09-18 (×3): qty 60

## 2011-09-18 MED ORDER — WITCH HAZEL-GLYCERIN EX PADS: 1.0000 "application " | MEDICATED_PAD | CUTANEOUS | Status: DC | PRN

## 2011-09-18 MED ORDER — NALBUPHINE SYRINGE 5 MG/0.5 ML
10.0000 mg | INJECTION | INTRAMUSCULAR | Status: DC | PRN
  Administered 2011-09-18: 10 mg via INTRAVENOUS
  Filled 2011-09-18 (×2): qty 1

## 2011-09-18 MED ORDER — DIPHENHYDRAMINE HCL 25 MG PO CAPS: 25.0000 mg | ORAL_CAPSULE | Freq: Four times a day (QID) | ORAL | Status: DC | PRN

## 2011-09-18 MED ORDER — FLEET ENEMA 7-19 GM/118ML RE ENEM: 1.0000 | ENEMA | RECTAL | Status: DC | PRN

## 2011-09-18 MED ORDER — FENTANYL 2.5 MCG/ML BUPIVACAINE 1/10 % EPIDURAL INFUSION (WH - ANES)
INTRAMUSCULAR | Status: DC | PRN
  Administered 2011-09-18: 14 mL/h via EPIDURAL

## 2011-09-18 MED ORDER — LIDOCAINE HCL 1.5 % IJ SOLN
INTRAMUSCULAR | Status: DC | PRN
  Administered 2011-09-18 (×2): 5 mL via EPIDURAL

## 2011-09-18 MED ORDER — DIBUCAINE 1 % RE OINT: 1.0000 "application " | TOPICAL_OINTMENT | RECTAL | Status: DC | PRN

## 2011-09-18 MED ORDER — MISOPROSTOL 200 MCG PO TABS
1000.0000 ug | ORAL_TABLET | ORAL | Status: DC | PRN
  Filled 2011-09-18: qty 5

## 2011-09-18 MED ORDER — EPHEDRINE 5 MG/ML INJ
10.0000 mg | INTRAVENOUS | Status: DC | PRN
  Filled 2011-09-18: qty 4

## 2011-09-18 MED ORDER — OXYCODONE-ACETAMINOPHEN 5-325 MG PO TABS
1.0000 | ORAL_TABLET | ORAL | Status: DC | PRN
  Administered 2011-09-19: 2 via ORAL
  Administered 2011-09-19: 1 via ORAL
  Administered 2011-09-19 – 2011-09-20 (×6): 2 via ORAL
  Administered 2011-09-20 (×2): 1 via ORAL
  Filled 2011-09-18 (×10): qty 2

## 2011-09-18 MED ORDER — PHENYLEPHRINE 40 MCG/ML (10ML) SYRINGE FOR IV PUSH (FOR BLOOD PRESSURE SUPPORT)
80.0000 ug | PREFILLED_SYRINGE | INTRAVENOUS | Status: DC | PRN
  Filled 2011-09-18 (×2): qty 5

## 2011-09-18 MED ORDER — ACETAMINOPHEN 325 MG PO TABS: 650.0000 mg | ORAL_TABLET | ORAL | Status: DC | PRN

## 2011-09-18 MED ORDER — BENZOCAINE-MENTHOL 20-0.5 % EX AERO: 1.0000 "application " | INHALATION_SPRAY | CUTANEOUS | Status: DC | PRN

## 2011-09-18 MED ORDER — OXYTOCIN BOLUS FROM INFUSION
500.0000 mL | Freq: Once | INTRAVENOUS | Status: DC
  Filled 2011-09-18: qty 1000
  Filled 2011-09-18: qty 500

## 2011-09-18 MED ORDER — SENNOSIDES-DOCUSATE SODIUM 8.6-50 MG PO TABS
2.0000 | ORAL_TABLET | Freq: Every day | ORAL | Status: DC
  Administered 2011-09-19: 2 via ORAL

## 2011-09-18 MED ORDER — PENICILLIN G POTASSIUM 5000000 UNITS IJ SOLR
5.0000 10*6.[IU] | Freq: Once | INTRAVENOUS | Status: DC
  Administered 2011-09-18: 5 10*6.[IU] via INTRAVENOUS
  Filled 2011-09-18: qty 5

## 2011-09-18 MED ORDER — IBUPROFEN 600 MG PO TABS
600.0000 mg | ORAL_TABLET | Freq: Four times a day (QID) | ORAL | Status: DC | PRN
  Administered 2011-09-18: 600 mg via ORAL
  Filled 2011-09-18: qty 1

## 2011-09-18 MED ORDER — ONDANSETRON HCL 4 MG PO TABS: 4.0000 mg | ORAL_TABLET | ORAL | Status: DC | PRN

## 2011-09-18 MED ORDER — LANOLIN HYDROUS EX OINT: TOPICAL_OINTMENT | CUTANEOUS | Status: DC | PRN

## 2011-09-18 MED ORDER — CITRIC ACID-SODIUM CITRATE 334-500 MG/5ML PO SOLN
30.0000 mL | ORAL | Status: DC | PRN
  Filled 2011-09-18: qty 15

## 2011-09-18 MED ORDER — ONDANSETRON HCL 4 MG/2ML IJ SOLN: 4.0000 mg | INTRAMUSCULAR | Status: DC | PRN

## 2011-09-18 MED ORDER — PRENATAL PLUS 27-1 MG PO TABS
1.0000 | ORAL_TABLET | Freq: Every day | ORAL | Status: DC
  Administered 2011-09-19 – 2011-09-20 (×2): 1 via ORAL
  Filled 2011-09-18 (×2): qty 1

## 2011-09-18 NOTE — Anesthesia Preprocedure Evaluation (Signed)
Anesthesia Evaluation  Name, MR# and DOB Patient awake  General Assessment Comment  Reviewed: Allergy & Precautions, H&P , NPO status , Patient's Chart, lab work & pertinent test results, reviewed documented beta blocker date and time   Airway Mallampati: III TM Distance: >3 FB Neck ROM: full    Dental No notable dental hx. (+) Teeth Intact   Pulmonary  clear to auscultation  pulmonary exam normalPulmonary Exam Normal breath sounds clear to auscultation none    Cardiovascular     Neuro/Psych Negative Neurological ROS  Negative Psych ROS  GI/Hepatic/Renal negative GI ROS  negative Liver ROS  negative Renal ROS        Endo/Other  Negative Endocrine ROS (+)   Morbid obesity  Abdominal Normal abdominal exam  (+) obese,   Musculoskeletal negative musculoskeletal ROS (+)   Hematology negative hematology ROS (+)   Peds  Reproductive/Obstetrics negative OB ROS (+) Pregnancy    Anesthesia Other Findings             Anesthesia Physical Anesthesia Plan  ASA: III  Anesthesia Plan: Epidural   Post-op Pain Management:    Induction:   Airway Management Planned:   Additional Equipment:   Intra-op Plan:   Post-operative Plan:   Informed Consent: I have reviewed the patients History and Physical, chart, labs and discussed the procedure including the risks, benefits and alternatives for the proposed anesthesia with the patient or authorized representative who has indicated his/her understanding and acceptance.   Dental Advisory Given and History available from chart only  Plan Discussed with: CRNA  Anesthesia Plan Comments:         Anesthesia Quick Evaluation

## 2011-09-18 NOTE — H&P (Signed)
Peggy [name] is a 30 y.o. female, [phone], at 37 6/7 weeks with twins, presenting from the office in active labor, with cervix 5,75%, twin A vtx, -1 station.  Denies leaking, reports some bloody show since exam.  U/S last week showed twins vtx/vtx.  Cervix has been 4 cm on previous exam last week.  Pregnancy remarkable for: Twins Hx SAB X3 Fibroids Hx Gestational HTN Smoker +GBS Persistent groin pain  HPI: Patient entered care at 11 weeks and 6 days. She had been diagnosed with twins at her primary GYN office of Dr. Lily Peer. She was on progesterone in early pregnancy. She desired first trimester screen and AFP.  She had an ultrasound at 18 weeks, with normal growth, fluid, and normal cervical length noted. Her twins were determined to be diamnionic dichorionic.  She had exposure to fifth disease at 19 weeks with negative coxsackie titers noted. She had a an ultrasound for growth and fluid in 19 weeks showing normal growth and fluid and normal cervical length.  She found a lump on her back at 19 weeks that was determined to be a likely lipoma. She was referred to general surgeon for evaluation.  She did not keep that appointment, and also did not keep appointments for physical therapy for her groin and back pain.  The rest of her pregnancy she was given sporadic prescriptions for Vicodin, and frequent discussions were held with the patient regarding the risks of Vicodin use in pregnancy.  She had another ultrasound at 29 weeks again with normal growth and fluid. Baby A was at the 37th percentile baby B was in the 71st percentile.  She had a normal Glucola. Her hemoglobin was within normal limits at her new OB visit and at 30 at 28 weeks.  She had another ultrasound at 32 weeks, showing twin A growth at the 29th percentile and twin B growth at the 54th percentile with some diminishing of fluid. She also was contracting on nonstress test, and was sent to [facility] hospital for Procardia. She had a  maternal fetal medicine consult as well. On the maternal fetal medicine consult, twin A showed 35th percentile of growth, twin B showed 83rd percentile of growth and amniotic fluid volume within normal limits, cervical length was also normal at that time.  By 35 weeks, her cervix was 3-4,80%, vertex at -1 station, and she was sent to [facility] hospital for admission for possible labor. She was observed overnight, without cervical change, and with diminishing of contractions. Therefore she was discharged home to await increased labor. She received her flu vaccine at 37 weeks. She then presented to [place] for a visit at 37 weeks, with her blood pressure 118/90. She also had headache and swelling. She was sent to [facility] hospital for a PIH workup, it was within normal limits.  She was seen in the office today for her regular visit, however she complained of significant pain. Her cervix was noted to be dilated to 5 cm, and she was sent to [facility] hospital for direct admit for labor care.  Maternal Medical History:  Reason for admission: Reason for admission: contractions.  Contractions: Onset was 3-5 hours ago.   Frequency: regular.   Perceived severity is moderate.    Fetal activity: Perceived fetal activity is normal.   Last perceived fetal movement was within the past hour.    Prenatal Complications - Diabetes: none.    OB History    Grav Para Term Preterm Abortions TAB SAB Ect Mult Living  5 1 1  0 3 0 3 0 0 1    Pregnancy #1--2003, SAB with D&C Pregnancy #2--2006.  SVB at 40 weeks, elevated BP, delivered at [facility], was on bedrest at 7 months Pregnancy #3--2010, SAB with D&C Pregnancy # 4--2010, SAB with D&C Pregnancy #5--Current  Past Medical History  Diagnosis Date  . No pertinent past medical history   Hx abnormal pap in past, with normal colpo.  Hx of depression. Smoker.  Fx arm at 26  Past Surgical History  Procedure Date  . Dilation and curettage of uterus   .  Tonsillectomy   3 D&Cs after SABs.  Hx fibroid removal with one D&C  Family History:  Mother with MVP and HTN; Mother, brother with migraines; Mother depression; Family are smokers  Social History:  reports that she has been smoking.  She has never used smokeless tobacco. She reports that she does not drink alcohol or use illicit drugs.  FOB involved and supportive Juluis Pitch).  Patient has some college, and is a Futures trader.  FOB is unemployed.  Patient is Caucasian, of the [place] faith.  Review of Systems  Constitutional: Negative.   HENT: Negative.   Eyes: Negative.   Respiratory: Negative.   Cardiovascular: Negative.   Gastrointestinal: Negative.   Genitourinary: Negative.   Musculoskeletal: Negative.   Skin: Negative.   Neurological: Negative.   Endo/Heme/Allergies: Negative.   Psychiatric/Behavioral: Negative.      Blood pressure 154/84, pulse 103, temperature 98.6 F (37 C), temperature source Oral, resp. rate 20, height 5\' 9"  (1.753 m), weight 103.42 kg (228 lb).  Maternal Exam:  Uterine Assessment: Contraction strength is moderate.  Contraction duration is 60 seconds. Contraction frequency is regular.   Abdomen: Patient reports no abdominal tenderness. Fundal height is 42 cm.   Fetal presentation: vertex     Physical Exam  Constitutional: She is oriented to person, place, and time. She appears well-developed and well-nourished. Distressed: breathing with contractions.  HENT:  Head: Normocephalic.  Eyes: Pupils are equal, round, and reactive to light.  Neck: Normal range of motion.  Cardiovascular: Normal rate and regular rhythm.   Respiratory: Effort normal and breath sounds normal.  GI: Soft. Bowel sounds are normal.  Genitourinary: Vagina normal and uterus normal.  Musculoskeletal: Normal range of motion.  Neurological: She is alert and oriented to person, place, and time.  Skin: Skin is warm and dry.  Psychiatric: She has a normal mood and affect. Her  behavior is normal. Judgment and thought content normal.   Cervix 5 cm, 75%, vtx -1 per Dr. Stefano Gaul in the office, small amount bloody show FHRs reactive x 2, no decelerations UCs q 2-4 min, moderate. Bedside U/S by tech for position--vtx/vtx  Prenatal labs: ABO, Rh:  O+ Antibody:  Neg Rubella:  Immune RPR: NON REACTIVE (09/07 1605)  HBsAg:   Neg HIV:   HR GBS:   Positive 1st trimester screen and AFP WNL Glucola WNL HGB WNL at NOB and at 28 weeks  Assessment/Plan: Twin IUP at 37 6/7 weeks Active labor Postive GBS Desires epidural  Plan: Admit to Birthing Suite per consult with Dr. Normand Sloop Routine MD orders Plan GBS Rx with PCN G per standard regimen Plan epidural.   Kohana Amble L 09/18/2011, 2:39 PM

## 2011-09-18 NOTE — Anesthesia Postprocedure Evaluation (Signed)
Anesthesia Post Note  Patient: Peggy [name]  Procedure(s) Performed: * No procedures listed *  Anesthesia type: Epidural  Patient location: Mother/Baby  Post pain: Pain level controlled  Post assessment: Post-op Vital signs reviewed  Last Vitals:  Filed Vitals:   09/18/11 2147  BP: 132/87  Pulse: 94  Temp:   Resp:     Post vital signs: Reviewed  Level of consciousness: awake  Complications: No apparent anesthesia complications 

## 2011-09-18 NOTE — Progress Notes (Signed)
Delivery Note   Rudean, Icenhour [[id number]]  At 8:37 PM a viable female was delivered via Vaginal, Spontaneous Delivery (Presentation: Right Occiput Anterior).  APGAR: 9, 9; weight 6 lb 1 oz (2750 g).   Placenta status: , Spontaneous.  Cord: 3 vessels with the following complications: None.  Anesthesia: Epidural  Episiotomy: none Lacerations: 1 degree Suture Repair: 2.0 chromic Est. Blood Loss (mL): 350cc    Yannely, Kintzel [[id number]]  At 8:49 PM a viable female was delivered via Vaginal, Spontaneous Delivery (Presentation: ;  ).  APGAR: 9, 10; weight 7 lb 2.6 oz (3250 g).   Placenta status: Intact, Spontaneous Pathology.  Cord: 3 vessels with the following complications: None.  Anesthesia: Epidural  Episiotomy:  Lacerations: 1st degree;Perineal Suture Repair: 2.0 chromic Est. Blood Loss (mL):    Mom to postpartum.   Baby A to nursery-stable.   Baby B to nursery-stable.  Sabrin Dunlevy A 09/18/2011, 9:20 PM

## 2011-09-18 NOTE — Anesthesia Postprocedure Evaluation (Signed)
Anesthesia Post Note  Patient: Peggy [name]  Procedure(s) Performed: * No procedures listed *  Anesthesia type: Epidural  Patient location: Mother/Baby  Post pain: Pain level controlled  Post assessment: Post-op Vital signs reviewed  Last Vitals:  Filed Vitals:   09/18/11 2147  BP: 132/87  Pulse: 94  Temp:   Resp:     Post vital signs: Reviewed  Level of consciousness: awake  Complications: No apparent anesthesia complications

## 2011-09-18 NOTE — Progress Notes (Signed)
Dr. Normand Sloop notified of pt status, SVE, FHRs, UC pattern, and epidural placement.  Will continue to monitor.

## 2011-09-18 NOTE — Anesthesia Procedure Notes (Signed)
Epidural Patient location during procedure: OB Start time: 09/18/2011 4:05 PM End time: 09/18/2011 4:14 PM  Staffing Anesthesiologist: Sandrea Hughs Performed by: anesthesiologist   Preanesthetic Checklist Completed: patient identified, site marked, surgical consent, pre-op evaluation, timeout performed, IV checked, risks and benefits discussed and monitors and equipment checked  Epidural Patient position: sitting Prep: site prepped and draped and DuraPrep Patient monitoring: continuous pulse ox and blood pressure Approach: midline Injection technique: LOR air  Needle:  Needle type: Tuohy  Needle gauge: 17 G Needle length: 9 cm Needle insertion depth: 7 cm Catheter type: closed end flexible Catheter size: 19 Gauge Catheter at skin depth: 11 cm Test dose: negative and 1.5% lidocaine  Assessment Sensory level: T8 Events: blood not aspirated, injection not painful, no injection resistance, negative IV test and no paresthesia  Additional Notes Blood at insertion site, R.N. Made aware

## 2011-09-18 NOTE — Progress Notes (Signed)
Dr. Normand Sloop notified of pt status, SVE, FHRs, UC pattern, and SROM clear fluid.  Will continue to monitor.

## 2011-09-19 LAB — DIFFERENTIAL
Basophils Relative: 0 % (ref 0–1)
Eosinophils Absolute: 0.1 10*3/uL (ref 0.0–0.7)
Monocytes Absolute: 0.9 10*3/uL (ref 0.1–1.0)
Monocytes Relative: 8 % (ref 3–12)

## 2011-09-19 LAB — CBC
HCT: 28.2 % — ABNORMAL LOW (ref 36.0–46.0)
Hemoglobin: 9.6 g/dL — ABNORMAL LOW (ref 12.0–15.0)
MCH: 31.4 pg (ref 26.0–34.0)
MCHC: 34 g/dL (ref 30.0–36.0)

## 2011-09-19 LAB — RPR: RPR Ser Ql: NONREACTIVE

## 2011-09-19 MED ORDER — FERROUS SULFATE 325 (65 FE) MG PO TABS
325.0000 mg | ORAL_TABLET | Freq: Every day | ORAL | Status: DC
  Administered 2011-09-19 – 2011-09-20 (×2): 325 mg via ORAL
  Filled 2011-09-19 (×2): qty 1

## 2011-09-19 NOTE — Progress Notes (Signed)
UR chart review completed.  

## 2011-09-19 NOTE — Progress Notes (Signed)
Post Partum Day 1 Subjective: up ad lib, voiding, tolerating PO, + flatus and working on breastfeeding.  VB lighter this AM.  Asking about going ambulating more this am No PIH s/s.  Objective: Blood pressure 118/80, pulse 87, temperature 97.6 F (36.4 C), temperature source Oral, resp. rate 20, height 5\' 9"  (1.753 m), weight 103.42 kg (228 lb), SpO2 97.00%, unknown if currently breastfeeding.  Physical Exam:  General: alert, cooperative and no distress Lochia: appropriate Uterine Fundus: firm u/-1 Incision: n/a DVT Evaluation: No evidence of DVT seen on physical exam. Negative Homan's sign. No significant calf/ankle edema.   Basename 09/19/11 0530 09/18/11 1445  HGB 9.6* 12.0  HCT 28.2* 35.4*    Assessment/Plan: Plan for discharge tomorrow and Breastfeeding Smoker.  Mild PP anemia. Will start po iron supplement.  LOS: 1 day   Wane Mollett H 09/19/2011, 9:36 AM

## 2011-09-19 NOTE — Addendum Note (Signed)
Addendum  created 09/19/11 4540 by Doreene Burke   Modules edited:Charges VN, Notes Section

## 2011-09-19 NOTE — Anesthesia Postprocedure Evaluation (Signed)
Anesthesia Post Note  Patient: Peggy [name]  Procedure(s) Performed: * No procedures listed *  Anesthesia type: Epidural  Patient location: Mother/Baby  Post pain: Pain level controlled  Post assessment: Post-op Vital signs reviewed  Last Vitals:  Filed Vitals:   09/19/11 0445  BP: 118/80  Pulse: 87  Temp: 97.6 F (36.4 C)  Resp: 20    Post vital signs: Reviewed  Level of consciousness: awake  Complications: No apparent anesthesia complications

## 2011-09-20 LAB — CBC
Hemoglobin: 9.1 g/dL — ABNORMAL LOW (ref 12.0–15.0)
MCH: 31.2 pg (ref 26.0–34.0)
MCHC: 34.1 g/dL (ref 30.0–36.0)
Platelets: 185 10*3/uL (ref 150–400)

## 2011-09-20 MED ORDER — IBUPROFEN 600 MG PO TABS: 600.0000 mg | ORAL_TABLET | Freq: Four times a day (QID) | ORAL | Status: AC

## 2011-09-20 MED ORDER — OXYCODONE-ACETAMINOPHEN 5-325 MG PO TABS: 1.0000 | ORAL_TABLET | ORAL | Status: AC | PRN

## 2011-09-20 NOTE — Progress Notes (Signed)
Post Partum Day 2 Subjective: no complaints.  Babies may have to stay due to jaundice, but decision has not been finalized yet.  Patient up ad lib, still has chronic back pain, but better.  Plans Mirena.  Objective: Blood pressure 109/72, pulse 83, temperature 97.9 F (36.6 C), temperature source Oral, resp. rate 20, height 5\' 9"  (1.753 m), weight 103.42 kg (228 lb), SpO2 97.00%, unknown if currently breastfeeding.  Physical Exam:  General: alert Lochia: appropriate Uterine Fundus: firm Incision: healing well DVT Evaluation: No evidence of DVT seen on physical exam.   Basename 09/20/11 0540 09/19/11 0530  HGB 9.1* 9.6*  HCT 26.7* 28.2*    Assessment/Plan: Discharge home.  Will remain with babies if they have to stay. Rx Ibuprophen and Percocet Follow-up in 5 weeks at [facility], with plan for Mirena insertion after that visit.   LOS: 2 days   Peggy [name] L 09/20/2011, 8:20 AM

## 2011-09-20 NOTE — Discharge Summary (Signed)
Obstetric Discharge Summary Reason for Admission: onset of labor Prenatal Procedures: ultrasound,  Intrapartum Procedures: spontaneous vaginal delivery of twins Postpartum Procedures: none Complications-Operative and Postpartum: 1st degree perineal laceration Hemoglobin  Date Value Range Status  09/20/2011 9.1* 12.0-15.0 (g/dL) Final     HCT  Date Value Range Status  09/20/2011 26.7* 36.0-46.0 (%) Final    Discharge Diagnoses: Term Pregnancy-delivered                                         Twin gestation  Discharge Information: Date: 09/20/2011 Activity: Per [facility] handout Diet: routine Medications: Ibuprophen and Percocet Condition: stable Instructions: refer to practice specific booklet Discharge to: home Follow-up Information    Make an appointment with [facility].       Make appointment for 5 weeks at [facility].  Newborn Data:   Gussie, Murton [[id number]]  Live born female  Birth Weight: 6 lb 1 oz (2750 g) APGAR: 9, 9   Halla, Chopp [[id number]]  Live born female  Birth Weight: 7 lb 2.6 oz (3250 g) APGAR: 9, 10  Babies may have to be made baby patients--see infants' charts for information.  Peggy [name] 09/20/2011, 8:11 AM

## 2011-12-04 LAB — US OB FOLLOW UP

## 2012-05-08 ENCOUNTER — Telehealth: Payer: Self-pay | Admitting: Obstetrics and Gynecology

## 2012-05-08 NOTE — Telephone Encounter (Signed)
Spoke with pt rgd msg.pt stated that she been bleeding thru a tampon and pad.per sr advised pt to take 600 mg of ibuprofen every  6 hours prn and the flow should slow down. Also advised pt if flow is still heavy she  can come to [facility] admissions . Made a f/u app with nd rgd b/c . Pt stated that she wants the iud . Pt's voice understanding. bt cma

## 2012-05-13 ENCOUNTER — Encounter: Payer: Self-pay | Admitting: Obstetrics and Gynecology

## 2012-05-13 ENCOUNTER — Ambulatory Visit (INDEPENDENT_AMBULATORY_CARE_PROVIDER_SITE_OTHER): Payer: Medicaid Other | Admitting: Obstetrics and Gynecology

## 2012-05-13 VITALS — BP 100/60 | Ht 70.0 in | Wt 191.0 lb

## 2012-05-13 DIAGNOSIS — Z124 Encounter for screening for malignant neoplasm of cervix: Secondary | ICD-10-CM

## 2012-05-13 DIAGNOSIS — N926 Irregular menstruation, unspecified: Secondary | ICD-10-CM

## 2012-05-13 DIAGNOSIS — O88219 Thromboembolism in pregnancy, unspecified trimester: Secondary | ICD-10-CM

## 2012-05-13 NOTE — Progress Notes (Signed)
Pt would like to discuss non-hormonal IUD options.

## 2012-05-13 NOTE — Progress Notes (Signed)
Last Pap: 2011 WNL: Yes Regular Periods:yes Contraception: none  Monthly Breast exam:yes Tetanus<60yrs:yes Nl.Bladder Function:yes Daily BMs:yes Healthy Diet:yes Calcium:no Mammogram:no Exercise:walk Seatbelt: yes Abuse at home: no Stressful work:no Sigmoid-colonoscopy: n/a Bone Density: Nopt with several concerns.  For the last two month her menses have been abnormal.  She had one day of heavy Vb changing a tampon q 15-30 minutes.  Her last period wasn't heavy but lasted for two weeks.  She had h/o of pulmonary embolus in the pp period.  Pt has not seen a hematologist.  She stopped her coumadin 5 months ago just on her own.  She desires birth control as well Physical Examination: General appearance - alert, well appearing, and in no distress Mental status - normal mood, behavior, speech, dress, motor activity, and thought processes Neck - supple, no significant adenopathy, thyroid exam: thyroid is normal in size without nodules or tenderness Chest - clear to auscultation, no wheezes, rales or rhonchi, symmetric air entry Heart - normal rate and regular rhythm Abdomen - soft, nontender, nondistended, no masses or organomegaly Breasts - breasts appear normal, no suspicious masses, no skin or nipple changes or axillary nodes Pelvic - normal external genitalia, vulva, vagina, cervix, uterus and adnexa Rectal - normal rectal, no masses, rectal exam not indicated Back exam - full range of motion, no tenderness, palpable spasm or pain on motion Neurological - alert, oriented, normal speech, no focal findings or movement disorder noted Musculoskeletal - no joint tenderness, deformity or swelling Extremities - no edema, redness or tenderness in the calves or thighs Skin - normal coloration and turgor, no rashes, no suspicious skin lesions noted Routine exam, Irreg menses, contraceptive counseling, h/o PE Pap sent yes Mammogram due no pt only able to use some BC.  refer to hematology and  will decide aththat time RT 2-3 weeks for [place]

## 2012-05-14 ENCOUNTER — Telehealth: Payer: Self-pay

## 2012-05-14 ENCOUNTER — Other Ambulatory Visit: Payer: Self-pay

## 2012-05-14 NOTE — Telephone Encounter (Signed)
Referral to hematology at [facility] for h/o of PE records faxed they will contact pt with appt

## 2012-05-14 NOTE — Telephone Encounter (Signed)
Message copied by Rolla Plate on Wed May 14, 2012  9:55 AM ------      Message from: Jaymes Graff      Created: Tue May 13, 2012  3:41 PM       Please refer pt to hematology in [place].  Pt with h/o pulmonary embolism in pp period.  If no one available then schedule in [facility]

## 2012-05-16 LAB — PAP IG, CT-NG, RFX HPV ASCU

## 2012-05-20 ENCOUNTER — Telehealth: Payer: Self-pay

## 2012-05-20 NOTE — Telephone Encounter (Signed)
referal to hematologist Dr.Lewis pt has appt 05/30/12 at 1245 lm on vm tcb rgd appt

## 2012-05-22 ENCOUNTER — Telehealth: Payer: Self-pay

## 2012-05-22 NOTE — Telephone Encounter (Signed)
Spoke with pt informed of appt with Dr.Lewis pt voice understanding

## 2012-05-22 NOTE — Telephone Encounter (Signed)
Lm to have pt call office

## 2012-05-22 NOTE — Telephone Encounter (Signed)
Spoke with pt rgd results informed pap abnl need colpo pt has appt 06/09/12 at 2:00 with ND pt voice understanding

## 2012-05-22 NOTE — Telephone Encounter (Signed)
Message copied by Rolla Plate on Thu May 22, 2012 11:11 AM ------      Message from: Jaymes Graff      Created: Wed May 21, 2012  8:43 PM       Please tell patient her pap results and that she can repeat her pap in one year.  Thank you

## 2012-05-22 NOTE — Telephone Encounter (Signed)
L/m for pt to call office

## 2012-06-04 ENCOUNTER — Encounter: Payer: Medicaid Other | Admitting: Obstetrics and Gynecology

## 2012-06-09 ENCOUNTER — Ambulatory Visit (INDEPENDENT_AMBULATORY_CARE_PROVIDER_SITE_OTHER): Payer: Medicaid Other | Admitting: Obstetrics and Gynecology

## 2012-06-09 ENCOUNTER — Encounter: Payer: Self-pay | Admitting: Obstetrics and Gynecology

## 2012-06-09 VITALS — BP 110/64 | Ht 70.0 in | Wt 196.0 lb

## 2012-06-09 DIAGNOSIS — R87610 Atypical squamous cells of undetermined significance on cytologic smear of cervix (ASC-US): Secondary | ICD-10-CM

## 2012-06-09 LAB — POCT URINE PREGNANCY: Preg Test, Ur: NEGATIVE

## 2012-06-09 NOTE — Progress Notes (Signed)
Previous Pap Smear: 5/13 Grade: ASCUS Previous Colposcopy: n/a Referred From: n/a LMP: 06/05/12 Contraception: none G,P: 5:3 HR HPV neg Colpo adequate Aw change at 12 o clock bx with ECC done RT for pap in 6 months

## 2013-08-17 ENCOUNTER — Encounter (HOSPITAL_COMMUNITY): Payer: Self-pay | Admitting: Emergency Medicine

## 2013-08-17 ENCOUNTER — Emergency Department (HOSPITAL_COMMUNITY)
Admission: EM | Admit: 2013-08-17 | Discharge: 2013-08-17 | Disposition: A | Discharge status: Home / Self Care | Payer: Medicaid Other | Attending: Emergency Medicine | Admitting: Emergency Medicine

## 2013-08-17 DIAGNOSIS — Z8249 Family history of ischemic heart disease and other diseases of the circulatory system: Secondary | ICD-10-CM | POA: Insufficient documentation

## 2013-08-17 DIAGNOSIS — F17200 Nicotine dependence, unspecified, uncomplicated: Secondary | ICD-10-CM | POA: Insufficient documentation

## 2013-08-17 DIAGNOSIS — K0889 Other specified disorders of teeth and supporting structures: Secondary | ICD-10-CM

## 2013-08-17 DIAGNOSIS — K089 Disorder of teeth and supporting structures, unspecified: Secondary | ICD-10-CM | POA: Insufficient documentation

## 2013-08-17 DIAGNOSIS — Z8742 Personal history of other diseases of the female genital tract: Secondary | ICD-10-CM | POA: Insufficient documentation

## 2013-08-17 LAB — COMPREHENSIVE METABOLIC PANEL
ALT: 15 U/L (ref 0–35)
Alkaline Phosphatase: 48 U/L (ref 39–117)
BUN: 5 mg/dL — ABNORMAL LOW (ref 6–23)
CO2: 24 mEq/L (ref 19–32)
Chloride: 107 mEq/L (ref 96–112)
GFR calc Af Amer: >90 mL/min (ref >90)
GFR calc non Af Amer: >90 mL/min (ref >90)
Glucose, Bld: 113 mg/dL — ABNORMAL HIGH (ref 70–99)
Potassium: 3.7 mEq/L (ref 3.5–5.1)
Total Bilirubin: 0.4 mg/dL (ref 0.3–1.2)

## 2013-08-17 LAB — CBC WITH DIFFERENTIAL/PLATELET
Eosinophils Absolute: 0 10*3/uL (ref 0.0–0.7)
Hemoglobin: 13.7 g/dL (ref 12.0–15.0)
Lymphocytes Relative: 8 % — ABNORMAL LOW (ref 12–46)
Lymphs Abs: 0.9 10*3/uL (ref 0.7–4.0)
MCH: 32.1 pg (ref 26.0–34.0)
Monocytes Relative: 3 % (ref 3–12)
Neutro Abs: 9.6 10*3/uL — ABNORMAL HIGH (ref 1.7–7.7)
Neutrophils Relative %: 89 % — ABNORMAL HIGH (ref 43–77)
RBC: 4.27 MIL/uL (ref 3.87–5.11)
WBC: 10.8 10*3/uL — ABNORMAL HIGH (ref 4.0–10.5)

## 2013-08-17 MED ORDER — OXYCODONE HCL 5 MG PO TABS
5.0000 mg | ORAL_TABLET | Freq: Once | ORAL | Status: AC
  Administered 2013-08-17: 5 mg via ORAL
  Filled 2013-08-17: qty 1

## 2013-08-17 MED ORDER — OXYCODONE HCL 5 MG PO TABS: 5.0000 mg | ORAL_TABLET | Freq: Four times a day (QID) | ORAL | Status: DC | PRN

## 2013-08-17 MED ORDER — SODIUM CHLORIDE 0.9 % IV BOLUS (SEPSIS)
1000.0000 mL | Freq: Once | INTRAVENOUS | Status: AC
  Administered 2013-08-17: 1000 mL via INTRAVENOUS

## 2013-08-17 MED ORDER — PENICILLIN V POTASSIUM 500 MG PO TABS: 500.0000 mg | ORAL_TABLET | Freq: Four times a day (QID) | ORAL | Status: DC

## 2013-08-17 NOTE — ED Provider Notes (Signed)
CSN: [id number]     Arrival date & time 08/17/13  1405 History     First MD Initiated Contact with Patient 08/17/13 1422     Chief Complaint  Patient presents with  . Dental Pain   (Consider location/radiation/quality/duration/timing/severity/associated sxs/prior Treatment) HPI Comments: Patient presents emergency department with chief complaint of dental pain. She states that it has been waxing and waning for several weeks, but the a couple of days ago she bit down on hard candy, and chipped her tooth. She states that since having her teeth, the pain is worsened. She states that it is severe in intensity, and has not improved with any over-the-counter pain medicine such as ibuprofen. She denies fevers, chills, nausea, or vomiting. She does have a dentist, but states that they were unable to see her.  The history is provided by the patient. No language interpreter was used.    Past Medical History  Diagnosis Date  . Fibroid   . Pregnancy induced hypertension    Past Surgical History  Procedure Laterality Date  . Dilation and curettage of uterus    . Tonsillectomy     Family History  Problem Relation Age of Onset  . Anesthesia problems Neg Hx   . Hypotension Neg Hx   . Malignant hyperthermia Neg Hx   . Pseudochol deficiency Neg Hx    History  Substance Use Topics  . Smoking status: Current Every Day Smoker -- 0.25 packs/day  . Smokeless tobacco: Never Used  . Alcohol Use: No   OB History   Grav Para Term Preterm Abortions TAB SAB Ect Mult Living   5 2 2  0 3 0 3 0 1 3     Review of Systems  All other systems reviewed and are negative.    Allergies  Tylenol  Home Medications   Current Outpatient Rx  Name  Route  Sig  Dispense  Refill  . oxyCODONE-acetaminophen (PERCOCET) 10-325 MG per tablet   Oral   Take 1 tablet by mouth every 4 (four) hours as needed.         . prenatal vitamin w/FE, FA (PRENATAL 1 + 1) 27-1 MG TABS   Oral   Take 1 tablet by mouth  daily.           BP 138/79  Pulse 72  Temp(Src) 98.3 F (36.8 C)  SpO2 100% Physical Exam  Nursing note and vitals reviewed. Constitutional: She is oriented to person, place, and time. She appears well-developed and well-nourished.  HENT:  Head: Normocephalic and atraumatic.  Mouth/Throat:    Poor dentition throughout.  Affected tooth as diagrammed.  No signs of peritonsillar or tonsillar abscess.  No signs of gingival abscess. Oropharynx is clear and without exudates.  Uvula is midline.  Airway is intact. No signs of Ludwig's angina.   Eyes: Conjunctivae and EOM are normal.  Neck: Normal range of motion.  Cardiovascular: Normal rate.   Pulmonary/Chest: Effort normal.  Abdominal: She exhibits no distension.  Musculoskeletal: Normal range of motion.  Neurological: She is alert and oriented to person, place, and time.  Skin: Skin is dry.  Psychiatric: She has a normal mood and affect. Her behavior is normal. Judgment and thought content normal.    ED Course   Procedures (including critical care time) ED ECG REPORT  I personally interpreted this EKG   Date: 08/17/2013   Rate: 126  Rhythm: sinus tachycardia  QRS Axis: normal  Intervals: normal  ST/T Wave abnormalities: normal  Conduction Disutrbances:none  Narrative Interpretation:   Old EKG Reviewed: none available   Labs Reviewed - No data to display No results found. 1. Pain, dental     MDM  Patient with toothache.  No gross abscess.  Exam unconcerning for Ludwig's angina or spread of infection.  Will treat with penicillin and pain medicine.  Urged patient to follow-up with dentist.    Patient was also found to be tachycardic here. Will check EKG, basic labs, give fluids, and will reevaluate.  3:21 PM Patient discussed with Dr. Rubin Payor, who also recommends checking CBC for anemia. Tachycardia could be pain related, will give pain medicine.  Patient signed out to [place], [place], who will continue care.     Plan: Re-evaluate after labs, fluids, and pain meds.     Roxy Horseman, PA-C 08/17/13 1538

## 2013-08-17 NOTE — ED Notes (Signed)
PA aware of elevated heart rate. Pt denies chest pain or any SOB.

## 2013-08-17 NOTE — ED Notes (Signed)
PT ambulated with baseline gait; VSS; A&Ox3; no signs of distress; respirations even and unlabored; skin warm and dry; no questions upon discharge.  

## 2013-08-17 NOTE — ED Notes (Signed)
Pt c/o tooth pain, bilateral upper canines, for "months". States is unable to get in to a dentist.

## 2013-08-17 NOTE — ED Notes (Addendum)
Facial pain has tooth pain same tooth on both side of mouth making face and head hurt was eating lolipop and one on left side broke

## 2013-08-18 NOTE — ED Provider Notes (Signed)
Medical screening examination/treatment/procedure(s) were performed by non-physician practitioner and as supervising physician I was immediately available for consultation/collaboration.  Bryor Rami R. Shaivi Rothschild, MD 08/18/13 1531 

## 2013-09-03 ENCOUNTER — Emergency Department (HOSPITAL_COMMUNITY)
Admission: EM | Admit: 2013-09-03 | Discharge: 2013-09-03 | Disposition: A | Discharge status: Home / Self Care | Payer: Medicaid Other | Attending: Emergency Medicine | Admitting: Emergency Medicine

## 2013-09-03 ENCOUNTER — Encounter (HOSPITAL_COMMUNITY): Payer: Self-pay | Admitting: Adult Health

## 2013-09-03 DIAGNOSIS — F17200 Nicotine dependence, unspecified, uncomplicated: Secondary | ICD-10-CM | POA: Insufficient documentation

## 2013-09-03 DIAGNOSIS — K0889 Other specified disorders of teeth and supporting structures: Secondary | ICD-10-CM

## 2013-09-03 DIAGNOSIS — Z8742 Personal history of other diseases of the female genital tract: Secondary | ICD-10-CM | POA: Insufficient documentation

## 2013-09-03 DIAGNOSIS — Z79899 Other long term (current) drug therapy: Secondary | ICD-10-CM | POA: Insufficient documentation

## 2013-09-03 DIAGNOSIS — K089 Disorder of teeth and supporting structures, unspecified: Secondary | ICD-10-CM | POA: Insufficient documentation

## 2013-09-03 DIAGNOSIS — Z8679 Personal history of other diseases of the circulatory system: Secondary | ICD-10-CM | POA: Insufficient documentation

## 2013-09-03 MED ORDER — OXYCODONE HCL 5 MG PO TABS: 5.0000 mg | ORAL_TABLET | ORAL | Status: DC | PRN

## 2013-09-03 NOTE — ED Provider Notes (Signed)
CSN: [id number]     Arrival date & time 09/03/13  1642 History  This chart was scribed for non-physician practitioner Jaynie Crumble, PA-C working with Flint Melter, MD by Valera Castle, ED scribe. This patient was seen in room TR08C/TR08C and the patient's care was started at 5:20 PM.    Chief Complaint  Patient presents with  . Dental Pain   The history is provided by the patient. No language interpreter was used.   HPI Comments: Peggy [name] is a 32 y.o. female who presents to the Emergency Department complaining of upper bilateral dental pain onset about 2 weeks ago, when a piece of a tooth broke off while she was eating. She states that anything cold or hot is very senstive to the injury. She has tried ibuprofen, oral gel, heating pad, and has gurgled salt, all with no relief. She reports a subjective fever earlier this week which has subsided. She called the doctor's office earlier today to get fit in, but they were not able to. She has an appointment in 6 days with an oral surgeon.    Past Medical History  Diagnosis Date  . Fibroid   . Pregnancy induced hypertension    Past Surgical History  Procedure Laterality Date  . Dilation and curettage of uterus    . Tonsillectomy     Family History  Problem Relation Age of Onset  . Anesthesia problems Neg Hx   . Hypotension Neg Hx   . Malignant hyperthermia Neg Hx   . Pseudochol deficiency Neg Hx    History  Substance Use Topics  . Smoking status: Current Every Day Smoker -- 0.25 packs/day  . Smokeless tobacco: Never Used  . Alcohol Use: No   OB History   Grav Para Term Preterm Abortions TAB SAB Ect Mult Living   5 2 2  0 3 0 3 0 1 3     Review of Systems  Constitutional: Positive for fever (subjective, subsided).  HENT: Positive for dental problem.   All other systems reviewed and are negative.    Allergies  Tylenol  Home Medications   Current Outpatient Rx  Name  Route  Sig  Dispense  Refill  .  ibuprofen (ADVIL,MOTRIN) 200 MG tablet   Oral   Take 800 mg by mouth every 6 (six) hours as needed for pain.         Marland Kitchen LORazepam (ATIVAN) 0.5 MG tablet   Oral   Take 0.5 mg by mouth as needed for anxiety.         . penicillin v potassium (VEETID) 500 MG tablet   Oral   Take 1 tablet (500 mg total) by mouth 4 (four) times daily.   40 tablet   0    Triage Vitals: BP 143/83  Pulse 91  Temp(Src) 98.2 F (36.8 C) (Oral)  Resp 16  SpO2 99%  Physical Exam  Nursing note and vitals reviewed. Constitutional: She is oriented to person, place, and time. She appears well-developed and well-nourished. No distress.  HENT:  Head: Normocephalic and atraumatic.  Right upper first bicuspid is partially broken off. Tender to palpation. No surrounding gum swelling or erythema. Good dentition.   Eyes: EOM are normal.  Neck: Neck supple. No tracheal deviation present.  Cardiovascular: Normal rate.   Pulmonary/Chest: Effort normal. No respiratory distress.  Musculoskeletal: Normal range of motion.  Neurological: She is alert and oriented to person, place, and time.  Skin: Skin is warm and dry.  Psychiatric: She  has a normal mood and affect. Her behavior is normal.    ED Course  Procedures (including critical care time)  DIAGNOSTIC STUDIES: Oxygen Saturation is 99% on room air, normal by my interpretation.    COORDINATION OF CARE: 5:26 PM-Discussed treatment plan which includes discharging the pt with a prescription for percocet with pt at bedside and pt agreed to plan. Advised pt to f/u with her dentist.     Labs Review Labs Reviewed - No data to display Imaging Review No results found.  MDM   1. Pain, dental     He should dental pain. No signs of infection on exam. Patient asking for pain medications. She has a followup appointment with her dentist and 6 days. Will start on ibuprofen, patient unable to take Norco or Tylenol, so we'll prescribe oxycodone 20 tablets. Patient  instructed to followup with her dentist as scheduled or sooner if possible.  Filed Vitals:   09/03/13 1653  BP: 143/83  Pulse: 91  Temp: 98.2 F (36.8 C)  TempSrc: Oral  Resp: 16  SpO2: 99%   I personally performed the services described in this documentation, which was scribed in my presence. The recorded information has been reviewed and is accurate.   Lottie Mussel, PA-C 09/03/13 2031  Lottie Mussel, PA-C 09/03/13 2032

## 2013-09-03 NOTE — ED Notes (Signed)
Presents with upper bilateral dental pain. Has an appointement on the 10th with an oral surgeon, unable to tolerate pain any longer.

## 2013-09-04 NOTE — ED Provider Notes (Signed)
Medical screening examination/treatment/procedure(s) were performed by non-physician practitioner and as supervising physician I was immediately available for consultation/collaboration.  Flint Melter, MD 09/04/13 [phone]

## 2013-09-16 ENCOUNTER — Emergency Department (HOSPITAL_COMMUNITY)
Admission: EM | Admit: 2013-09-16 | Discharge: 2013-09-16 | Disposition: A | Discharge status: Home / Self Care | Payer: Medicaid Other | Attending: Emergency Medicine | Admitting: Emergency Medicine

## 2013-09-16 ENCOUNTER — Encounter (HOSPITAL_COMMUNITY): Payer: Self-pay | Admitting: Emergency Medicine

## 2013-09-16 DIAGNOSIS — K0381 Cracked tooth: Secondary | ICD-10-CM | POA: Insufficient documentation

## 2013-09-16 DIAGNOSIS — F17200 Nicotine dependence, unspecified, uncomplicated: Secondary | ICD-10-CM | POA: Insufficient documentation

## 2013-09-16 DIAGNOSIS — Z8742 Personal history of other diseases of the female genital tract: Secondary | ICD-10-CM | POA: Insufficient documentation

## 2013-09-16 DIAGNOSIS — K089 Disorder of teeth and supporting structures, unspecified: Secondary | ICD-10-CM | POA: Insufficient documentation

## 2013-09-16 DIAGNOSIS — K0889 Other specified disorders of teeth and supporting structures: Secondary | ICD-10-CM

## 2013-09-16 MED ORDER — OXYCODONE HCL 15 MG PO TABS: 7.5000 mg | ORAL_TABLET | Freq: Four times a day (QID) | ORAL | Status: DC | PRN

## 2013-09-16 MED ORDER — AMOXICILLIN 500 MG PO CAPS: 500.0000 mg | ORAL_CAPSULE | Freq: Three times a day (TID) | ORAL | Status: DC

## 2013-09-16 NOTE — ED Provider Notes (Signed)
CSN: [id number]     Arrival date & time 09/16/13  1247 History  This chart was scribed for non-physician practitioner Arthor Captain, PA-C, working with Vida Roller, MD by Dorothey Baseman, ED Scribe. This patient was seen in room TR05C/TR05C and the patient's care was started at 1:57 PM.    Chief Complaint  Patient presents with  . Dental Pain   Patient is a 32 y.o. female presenting with tooth pain. The history is provided by the patient. No language interpreter was used.  Dental Pain Location:  Upper Severity:  Moderate Onset quality:  Gradual Timing:  Constant Context: dental fracture   Worsened by:  Cold food/drink and hot food/drink Associated symptoms: no facial swelling    HPI Comments: Peggy [name] is a 32 y.o. female who presents to the Emergency Department complaining of upper, right-sided dental pain associated with a broken tooth that is exacerbated with hot/cold food and drink. Patient was seen here on 08/17/2013, or 1 month ago, for the broken tooth and was given pain medication and antibiotics and was referred to oral surgery. She states that the pain medications only provided mild, temporary relief. Patient showed an orthopantogram that indicated a small cyst to the upper right side and states that the oral surgeon expressed concern of an infection. She reports that she has an appointment with a dentist on Monday, or 5 days from today.   Past Medical History  Diagnosis Date  . Fibroid   . Pregnancy induced hypertension    Past Surgical History  Procedure Laterality Date  . Dilation and curettage of uterus    . Tonsillectomy     Family History  Problem Relation Age of Onset  . Anesthesia problems Neg Hx   . Hypotension Neg Hx   . Malignant hyperthermia Neg Hx   . Pseudochol deficiency Neg Hx    History  Substance Use Topics  . Smoking status: Current Every Day Smoker -- 0.25 packs/day  . Smokeless tobacco: Never Used  . Alcohol Use: No   OB History   Grav  Para Term Preterm Abortions TAB SAB Ect Mult Living   5 2 2  0 3 0 3 0 1 3     Review of Systems  HENT: Negative for facial swelling.   All other systems reviewed and are negative.    Allergies  Tylenol  Home Medications   Current Outpatient Rx  Name  Route  Sig  Dispense  Refill  . ibuprofen (ADVIL,MOTRIN) 200 MG tablet   Oral   Take 800 mg by mouth every 6 (six) hours as needed for pain.         Marland Kitchen LORazepam (ATIVAN) 0.5 MG tablet   Oral   Take 0.5 mg by mouth as needed for anxiety.          Triage Vitals: BP 130/93  Pulse 121  Temp(Src) 98.6 F (37 C) (Oral)  Resp 22  SpO2 100%  Physical Exam  Nursing note and vitals reviewed. Constitutional: She is oriented to person, place, and time. She appears well-developed and well-nourished. No distress.  HENT:  Head: Normocephalic and atraumatic.  Fractured right incisor with exposed dentin and pulp.  Eyes: Conjunctivae are normal.  Neck: Normal range of motion. Neck supple.  Musculoskeletal: Normal range of motion.  Neurological: She is alert and oriented to person, place, and time.  Skin: Skin is warm and dry.  Psychiatric: She has a normal mood and affect. Her behavior is normal.  ED Course  Procedures (including critical care time)  DIAGNOSTIC STUDIES: Oxygen Saturation is 100% on room air, normal by my interpretation.    COORDINATION OF CARE: 2:00PM- Administered an injection of Bupivicaine to numb the area. Advised patient to eat soft foods until symptoms subside. Will discharge patient with oxycodone to manage pain symptoms.  Discussed treatment plan with patient at bedside and patient verbalized agreement.   Dental Performed by: Arthor Captain Authorized by: Arthor Captain Consent: Verbal consent obtained. Patient understanding: patient states understanding of the procedure being performed Patient identity confirmed: verbally with patient Local anesthesia used: yes Local anesthetic: bupivacaine  0.5% with epinephrine Anesthetic total: 0.4 ml Patient sedated: no Patient tolerance: Patient tolerated the procedure well with no immediate complications.     Labs Review Labs Reviewed - No data to display Imaging Review No results found.  MDM   1. Pain, dental    Patient with tooth fracture,exposed pulp, and dentin.  No gross abscess.  Exam unconcerning for Ludwig's angina or spread of infection.  patient found sig relief with neve block. Will treat with amox and pain medicine.  Patient has dental appointment in 5 days. Will cover for infection.   I personally performed the services described in this documentation, which was scribed in my presence. The recorded information has been reviewed and is accurate.     Arthor Captain, PA-C 09/17/13 2023

## 2013-09-16 NOTE — ED Notes (Signed)
Pt started here 8/18 for a broken tooth. Was referred to a oral surgery. Saw oral sx on 9/10. He told the pt that "she needed to start by seeing a dentist". Pt has appointment with dentist on Monday. Pt has 7/10 pain.

## 2013-09-17 NOTE — ED Provider Notes (Signed)
Medical screening examination/treatment/procedure(s) were performed by non-physician practitioner and as supervising physician I was immediately available for consultation/collaboration.    Caitlan Chauca D Angelette Ganus, MD 09/17/13 2101 

## 2013-09-29 ENCOUNTER — Encounter (HOSPITAL_COMMUNITY): Payer: Self-pay

## 2013-09-29 ENCOUNTER — Emergency Department (HOSPITAL_COMMUNITY)
Admission: EM | Admit: 2013-09-29 | Discharge: 2013-09-29 | Disposition: A | Discharge status: Home / Self Care | Payer: Medicaid Other | Attending: Emergency Medicine | Admitting: Emergency Medicine

## 2013-09-29 DIAGNOSIS — Z8742 Personal history of other diseases of the female genital tract: Secondary | ICD-10-CM | POA: Insufficient documentation

## 2013-09-29 DIAGNOSIS — Z792 Long term (current) use of antibiotics: Secondary | ICD-10-CM | POA: Insufficient documentation

## 2013-09-29 DIAGNOSIS — K089 Disorder of teeth and supporting structures, unspecified: Secondary | ICD-10-CM | POA: Insufficient documentation

## 2013-09-29 DIAGNOSIS — F17200 Nicotine dependence, unspecified, uncomplicated: Secondary | ICD-10-CM | POA: Insufficient documentation

## 2013-09-29 DIAGNOSIS — K0889 Other specified disorders of teeth and supporting structures: Secondary | ICD-10-CM

## 2013-09-29 DIAGNOSIS — R51 Headache: Secondary | ICD-10-CM | POA: Insufficient documentation

## 2013-09-29 MED ORDER — OXYCODONE HCL 15 MG PO TABS: ORAL_TABLET | ORAL | Status: DC

## 2013-09-29 MED ORDER — OXYCODONE HCL 5 MG PO TABS
15.0000 mg | ORAL_TABLET | Freq: Once | ORAL | Status: AC
  Administered 2013-09-29: 15 mg via ORAL
  Filled 2013-09-29: qty 3

## 2013-09-29 MED ORDER — AMOXICILLIN 500 MG PO CAPS: 500.0000 mg | ORAL_CAPSULE | Freq: Three times a day (TID) | ORAL | Status: DC

## 2013-09-29 NOTE — ED Notes (Signed)
Hx of broken tooth and has appt with oral surgeon on the 10th and was told to see dentist first befroe he could do anything.

## 2013-09-29 NOTE — ED Provider Notes (Signed)
CSN: [id number]     Arrival date & time 09/29/13  1354 History   First MD Initiated Contact with Patient 09/29/13 1417     Chief Complaint  Patient presents with  . Dental Pain   (Consider location/radiation/quality/duration/timing/severity/associated sxs/prior Treatment) The history is provided by the patient. No language interpreter was used.  Peggy [name] is a 32 y/o F with PMHx of fibroids presenting to the ED with tooth pain that has been ongoing since August 17, 2013. Patient reported that on this day she was eating a blow-pop, bit down and cracked her tooth. Patient presents to the ED today with worsening pain to the same tooth, reported that there is an intermittent, throbbing sensation that worsens when eating, hot and cold fluids, and air. Patient reported that pain medication makes the pain better. Patient reported that she was seen for the same problem on 09/16/2013 - reported that she was discharged with pain medications and antibiotics, Amoxicillin. Patient reported that she saw the oral surgeon first, Dr. Barbette Merino, and was then told to go see a dentist. Patient reported that she was seen by the dentist in [place] on 09/21/2013. Patient reported that she has an appointment with Dr. Barbette Merino for tooth extraction on 10/06/2013. Patient reported that the pain is worse and that the Oxycodone that she was discharged with helped alleviate the pain. Denied radiation of pain. Denied drainage, bleeding, fever, chills, jaw pain, chest pain, shortness of breath, difficulty breathing, neck pain, neck stiffness, facial swelling.  PCP none   Past Medical History  Diagnosis Date  . Fibroid   . Pregnancy induced hypertension    Past Surgical History  Procedure Laterality Date  . Dilation and curettage of uterus    . Tonsillectomy     Family History  Problem Relation Age of Onset  . Anesthesia problems Neg Hx   . Hypotension Neg Hx   . Malignant hyperthermia Neg Hx   . Pseudochol deficiency  Neg Hx    History  Substance Use Topics  . Smoking status: Current Every Day Smoker -- 0.25 packs/day  . Smokeless tobacco: Never Used  . Alcohol Use: No   OB History   Grav Para Term Preterm Abortions TAB SAB Ect Mult Living   5 2 2  0 3 0 3 0 1 3     Review of Systems  Constitutional: Negative for fever and chills.  HENT: Positive for dental problem. Negative for ear pain, trouble swallowing, neck pain and neck stiffness.   Respiratory: Negative for chest tightness and shortness of breath.   Cardiovascular: Negative for chest pain.  Neurological: Positive for headaches.  All other systems reviewed and are negative.    Allergies  Tylenol  Home Medications   Current Outpatient Rx  Name  Route  Sig  Dispense  Refill  . ibuprofen (ADVIL,MOTRIN) 200 MG tablet   Oral   Take 800 mg by mouth every 6 (six) hours as needed for pain.         Marland Kitchen LORazepam (ATIVAN) 0.5 MG tablet   Oral   Take 0.5 mg by mouth as needed for anxiety.         Marland Kitchen oxyCODONE (ROXICODONE) 15 MG immediate release tablet   Oral   Take 7.5-15 mg by mouth every 6 (six) hours as needed for pain.         Marland Kitchen amoxicillin (AMOXIL) 500 MG capsule   Oral   Take 500 mg by mouth 3 (three) times daily.         Marland Kitchen  amoxicillin (AMOXIL) 500 MG capsule   Oral   Take 1 capsule (500 mg total) by mouth 3 (three) times daily.   21 capsule   0   . oxyCODONE (ROXICODONE) 15 MG immediate release tablet      Take 0.5 tablet PO every 8 hours as needed for pain.   15 tablet   0    BP 123/87  Pulse 84  Resp 18  Ht 5\' 10"  (1.778 m)  Wt 183 lb (83.008 kg)  BMI 26.26 kg/m2  SpO2 100% Physical Exam  Nursing note and vitals reviewed. Constitutional: She is oriented to person, place, and time. She appears well-developed and well-nourished. No distress.  HENT:  Head: Normocephalic and atraumatic.  Mouth/Throat: Oropharynx is clear and moist. No oropharyngeal exudate.    Negative facial swelling identified.  Negative pain upon palpation to the right maxillary region.  Diagrammed right canine of maxillary region identified, pulp with tooth exposed. Pain upon palpation with tongue depressor. Negative abscess formation. Negative active bleeding or drainage identified. Negative sublingual lesions. Uvula midline, symmetrical elevation. Negative trismus. Negative signs of peritonsillar abscess. Negative signs of Ludwig's angina. Negative lesions, sores, inflammation, swelling, erythema identified to the buccal mucosa and gum lines.  Eyes: Conjunctivae and EOM are normal. Pupils are equal, round, and reactive to light. Right eye exhibits no discharge. Left eye exhibits no discharge.  Neck: Normal range of motion. Neck supple.  Negative neck stiffness Negative nuchal rigidity Negative lymphadenopathy  Cardiovascular: Normal rate, regular rhythm and normal heart sounds.  Exam reveals no friction rub.   No murmur heard. Pulses:      Radial pulses are 2+ on the right side, and 2+ on the left side.  Pulmonary/Chest: Effort normal and breath sounds normal. No respiratory distress. She has no wheezes. She has no rales.  Lymphadenopathy:    She has no cervical adenopathy.  Neurological: She is alert and oriented to person, place, and time. No cranial nerve deficit. She exhibits normal muscle tone. Coordination normal.  Cranial nerves III through XII grossly intact  Skin: Skin is warm and dry. No rash noted. She is not diaphoretic. No erythema.  Psychiatric: She has a normal mood and affect. Her behavior is normal. Thought content normal.    ED Course  Procedures (including critical care time) Labs Review Labs Reviewed - No data to display Imaging Review No results found.  MDM   1. Pain, dental    Patient presenting to emergency department with dental pain has been ongoing since 08/17/2013. Patient was seen in the emergency department on 09/16/2013 where she was discharged with antibiotics, amoxicillin,  and pain medications. Patient reported that she so oral surgeon, Dr. Barbette Merino, and dentist. Patient reported that she has an appointment with Dr. Barbette Merino, oral surgeon, on 10/06/2013 and is scheduled for tooth extraction. Alert and oriented. Negative facial swelling identified. Negative pain upon palpation to the maxillary sinus region. Diagrammed right canine maxillary region identified, pain upon palpation with tongue depressor. Negative signs of abscess or cyst formation. Negative signs of acute infection. Negative findings for peritonsillar abscess. Negative findings for Ludwig's angina. Negative trismus. Cranial nerves grossly intact. Patient stable. Tooth pain due to diagrammed tooth, discussed with patient that she needs to keep appointment with oral surgeon in order for tooth pain to be relieved she needs to get tooth extracted. Discharged patient with pain medication-discussed course, precautions, disposal. Discharge patient with antibiotics, discussed with patient on symptoms to watch out for regarding infection-discussed with patient to  take medications if she starts to experience these symptoms. Referred patient to Dr. Barbette Merino, oral surgeon. Discussed with patient to closely monitor symptoms and symptoms are to worsen or change to report back to emergency department-strict return instructions given. Patient agreed to plan of care, understood, all questions answered.  Raymon Mutton, PA-C 09/29/13 1639

## 2013-09-30 NOTE — ED Provider Notes (Signed)
Medical screening examination/treatment/procedure(s) were performed by non-physician practitioner and as supervising physician I was immediately available for consultation/collaboration.  Doug Sou, MD 09/30/13 1143

## 2013-11-16 ENCOUNTER — Encounter (HOSPITAL_COMMUNITY): Payer: Self-pay | Admitting: Emergency Medicine

## 2013-11-16 ENCOUNTER — Emergency Department (HOSPITAL_COMMUNITY)
Admission: EM | Admit: 2013-11-16 | Discharge: 2013-11-16 | Disposition: A | Discharge status: Home / Self Care | Payer: Medicaid Other | Attending: Emergency Medicine | Admitting: Emergency Medicine

## 2013-11-16 DIAGNOSIS — Z79899 Other long term (current) drug therapy: Secondary | ICD-10-CM | POA: Insufficient documentation

## 2013-11-16 DIAGNOSIS — Z8742 Personal history of other diseases of the female genital tract: Secondary | ICD-10-CM | POA: Insufficient documentation

## 2013-11-16 DIAGNOSIS — K029 Dental caries, unspecified: Secondary | ICD-10-CM | POA: Insufficient documentation

## 2013-11-16 DIAGNOSIS — F17200 Nicotine dependence, unspecified, uncomplicated: Secondary | ICD-10-CM | POA: Insufficient documentation

## 2013-11-16 DIAGNOSIS — Z888 Allergy status to other drugs, medicaments and biological substances status: Secondary | ICD-10-CM | POA: Insufficient documentation

## 2013-11-16 MED ORDER — AMOXICILLIN 500 MG PO CAPS: 500.0000 mg | ORAL_CAPSULE | Freq: Three times a day (TID) | ORAL | Status: DC

## 2013-11-16 MED ORDER — MORPHINE SULFATE 4 MG/ML IJ SOLN
4.0000 mg | Freq: Once | INTRAMUSCULAR | Status: AC
  Administered 2013-11-16: 4 mg via INTRAMUSCULAR
  Filled 2013-11-16: qty 1

## 2013-11-16 MED ORDER — OXYCODONE HCL 5 MG PO TABS: 5.0000 mg | ORAL_TABLET | ORAL | Status: DC | PRN

## 2013-11-16 NOTE — ED Provider Notes (Signed)
Medical screening examination/treatment/procedure(s) were performed by non-physician practitioner and as supervising physician I was immediately available for consultation/collaboration.  EKG Interpretation   None        Martha K Linker, MD 11/16/13 2305 

## 2013-11-16 NOTE — ED Notes (Signed)
Pt. reports persistent right and left lower molar pain " it broke off " for several days unrelieved by OTC Motrin .

## 2013-11-16 NOTE — ED Provider Notes (Signed)
CSN: [id number]     Arrival date & time 11/16/13  2023 History  This chart was scribed for non-physician practitioner, Wynetta Emery, PA-C working with Ethelda Chick, MD by Greggory Stallion, ED scribe. This patient was seen in room TR06C/TR06C and the patient's care was started at 9:39 PM.   Chief Complaint  Patient presents with  . Dental Pain   The history is provided by the patient. No language interpreter was used.   HPI Comments: Peggy [name] is a 32 y.o. female who presents to the Emergency Department complaining of gradual onset, constant right and left lower dental pain that started several days ago. She has taken Motrin with no relief. Pt states she went to a dentist and Medicaid will not cover what she needs to get done so she is working on a payment plan with a dental clinic. Patient denies fever, trismus, nausea vomiting.  Past Medical History  Diagnosis Date  . Fibroid   . Pregnancy induced hypertension    Past Surgical History  Procedure Laterality Date  . Dilation and curettage of uterus    . Tonsillectomy     Family History  Problem Relation Age of Onset  . Anesthesia problems Neg Hx   . Hypotension Neg Hx   . Malignant hyperthermia Neg Hx   . Pseudochol deficiency Neg Hx    History  Substance Use Topics  . Smoking status: Current Every Day Smoker -- 0.25 packs/day  . Smokeless tobacco: Never Used  . Alcohol Use: No   OB History   Grav Para Term Preterm Abortions TAB SAB Ect Mult Living   5 2 2  0 3 0 3 0 1 3     Review of Systems A complete 10 system review of systems was obtained and all systems are negative except as noted in the HPI and PMH.   Allergies  Tylenol  Home Medications   Current Outpatient Rx  Name  Route  Sig  Dispense  Refill  . ibuprofen (ADVIL,MOTRIN) 200 MG tablet   Oral   Take 800 mg by mouth every 6 (six) hours as needed for pain.         Marland Kitchen LORazepam (ATIVAN) 0.5 MG tablet   Oral   Take 0.5 mg by mouth as  needed for anxiety.         Marland Kitchen oxyCODONE (ROXICODONE) 15 MG immediate release tablet   Oral   Take 7.5-15 mg by mouth every 6 (six) hours as needed for pain.         Marland Kitchen amoxicillin (AMOXIL) 500 MG capsule   Oral   Take 1 capsule (500 mg total) by mouth 3 (three) times daily.   21 capsule   0   . amoxicillin (AMOXIL) 500 MG capsule   Oral   Take 1 capsule (500 mg total) by mouth 3 (three) times daily.   30 capsule   0   . oxyCODONE (ROXICODONE) 5 MG immediate release tablet   Oral   Take 1 tablet (5 mg total) by mouth every 4 (four) hours as needed. Take 1-2 tablets every 4-6 hours as needed for pain control   15 tablet   0    BP 149/93  Pulse 112  Temp(Src) 98.2 F (36.8 C) (Oral)  Resp 16  Ht 5\' 11"  (1.803 m)  Wt 205 lb 1 oz (93.016 kg)  BMI 28.61 kg/m2  SpO2 99%  LMP 11/09/2013  Physical Exam  Nursing note and vitals reviewed. Constitutional: She is  oriented to person, place, and time. She appears well-developed and well-nourished. No distress.  HENT:  Head: Normocephalic.  Generally poor dentition multiple caries into the gumline with crumbling teeth, no gingival swelling, erythema or tenderness to palpation. Patient is handling their secretions. There is no tenderness to palpation or firmness underneath tongue bilaterally. No trismus.    Eyes: Conjunctivae and EOM are normal.  Cardiovascular: Normal rate.   Pulmonary/Chest: Effort normal. No stridor.  Musculoskeletal: Normal range of motion.  Neurological: She is alert and oriented to person, place, and time.  Psychiatric: She has a normal mood and affect.    ED Course  Procedures (including critical care time)  DIAGNOSTIC STUDIES: Oxygen Saturation is 99% on RA, normal by my interpretation.    COORDINATION OF CARE: 9:42 PM-Discussed treatment plan which includes pain medication in the ED and for home and an antibiotic with pt at bedside and pt agreed to plan.   Labs Review Labs Reviewed - No data to  display Imaging Review No results found.  EKG Interpretation   None       MDM   1. Dental caries      Filed Vitals:   11/16/13 2026 11/16/13 2215  BP: 149/93 110/62  Pulse: 112 93  Temp: 98.2 F (36.8 C) 98 F (36.7 C)  TempSrc: Oral Oral  Resp: 16 18  Height: 5\' 11"  (1.803 m)   Weight: 205 lb 1 oz (93.016 kg)   SpO2: 99% 100%     Peggy [name] is a 32 y.o. female Patient with toothache.  No gross abscess.  Exam unconcerning for Ludwig's angina or spread of infection.  Will treat with penicillin and pain medicine.  Urged patient to follow-up with dentist.     Medications  morphine 4 MG/ML injection 4 mg (4 mg Intramuscular Given 11/16/13 2149)    Pt is hemodynamically stable, appropriate for, and amenable to discharge at this time. Pt verbalized understanding and agrees with care plan. All questions answered. Outpatient follow-up and specific return precautions discussed.    Discharge Medication List as of 11/16/2013  9:44 PM    START taking these medications   Details  !! amoxicillin (AMOXIL) 500 MG capsule Take 1 capsule (500 mg total) by mouth 3 (three) times daily., Starting 11/16/2013, Until Discontinued, Print    !! oxyCODONE (ROXICODONE) 5 MG immediate release tablet Take 1 tablet (5 mg total) by mouth every 4 (four) hours as needed. Take 1-2 tablets every 4-6 hours as needed for pain control, Starting 11/16/2013, Until Discontinued, Print     !! - Potential duplicate medications found. Please discuss with provider.      I personally performed the services described in this documentation, which was scribed in my presence. The recorded information has been reviewed and is accurate.  Note: Portions of this report may have been transcribed using voice recognition software. Every effort was made to ensure accuracy; however, inadvertent computerized transcription errors may be present    Wynetta Emery, PA-C 11/16/13 2303

## 2013-11-16 NOTE — Discharge Instructions (Signed)
Return to the emergency room for fever, change in vision, redness to the face that rapidly spreads towards the eye, nausea or vomiting, difficulty swallowing or shortness of breath.  Take percocet for breakthrough pain, do not drink alcohol, drive, care for children or do other critical tasks while taking percocet.   You may follow with the dentist listed above. Alternatively, you may also contact   Low-cost dental clinic: [facility]  at [phone]**  **Nuala Alpha at [phone] [address]    You may also call [phone]  Dental Assistance If the dentist on-call cannot see you, please use the resources below:   Patients with Medicaid: [facility] [phone] [address], [phone] [address] [phone]  If unable to pay, or uninsured, contact [facility] [phone]) or [facility] Department [phone] in [place], [phone] in [facility]) to become qualified for the adult dental clinic  Other Low-Cost Community Dental Services: [facility]- [address] Natasha Bence [place], [place], [address]    [phone], Ext. [phone]    2nd and 4th Thursday of the month at 6:30am    10 clients each day by appointment, can sometimes see walk-in     patients if someone does not show for an appointment [facility]- [address] Ether Griffins [place], [place], [address]    [phone] [facility] [address] [place], [place], [address]    [phone]  [facility]- [phone] [facility] Department- [phone] [facility] Department- [phone]

## 2013-11-19 ENCOUNTER — Encounter (HOSPITAL_COMMUNITY): Payer: Self-pay | Admitting: Emergency Medicine

## 2013-11-19 ENCOUNTER — Emergency Department (HOSPITAL_COMMUNITY)
Admission: EM | Admit: 2013-11-19 | Discharge: 2013-11-19 | Disposition: A | Discharge status: Home / Self Care | Payer: Medicaid Other | Attending: Emergency Medicine | Admitting: Emergency Medicine

## 2013-11-19 DIAGNOSIS — K0889 Other specified disorders of teeth and supporting structures: Secondary | ICD-10-CM

## 2013-11-19 DIAGNOSIS — Z792 Long term (current) use of antibiotics: Secondary | ICD-10-CM | POA: Insufficient documentation

## 2013-11-19 DIAGNOSIS — Z8742 Personal history of other diseases of the female genital tract: Secondary | ICD-10-CM | POA: Insufficient documentation

## 2013-11-19 DIAGNOSIS — F17200 Nicotine dependence, unspecified, uncomplicated: Secondary | ICD-10-CM | POA: Insufficient documentation

## 2013-11-19 DIAGNOSIS — K089 Disorder of teeth and supporting structures, unspecified: Secondary | ICD-10-CM | POA: Insufficient documentation

## 2013-11-19 DIAGNOSIS — K029 Dental caries, unspecified: Secondary | ICD-10-CM | POA: Insufficient documentation

## 2013-11-19 MED ORDER — OXYCODONE HCL 5 MG PO TABS
10.0000 mg | ORAL_TABLET | Freq: Once | ORAL | Status: AC
  Administered 2013-11-19: 10 mg via ORAL
  Filled 2013-11-19: qty 2

## 2013-11-19 MED ORDER — BUPIVACAINE-EPINEPHRINE PF 0.5-1:200000 % IJ SOLN: 1.8000 mL | Freq: Once | INTRAMUSCULAR | Status: DC

## 2013-11-19 MED ORDER — BUPIVACAINE-EPINEPHRINE PF 0.5-1:200000 % IJ SOLN
1.8000 mL | INTRAMUSCULAR | Status: AC
  Administered 2013-11-19: 9 mg
  Filled 2013-11-19: qty 1.8

## 2013-11-19 MED ORDER — OXYCODONE HCL 5 MG PO TABS: 5.0000 mg | ORAL_TABLET | ORAL | Status: DC | PRN

## 2013-11-19 NOTE — ED Provider Notes (Signed)
Medical screening examination/treatment/procedure(s) were performed by non-physician practitioner and as supervising physician I was immediately available for consultation/collaboration.    Sheehan Stacey R Claudell Wohler, MD 11/19/13 1603 

## 2013-11-19 NOTE — ED Notes (Addendum)
C/o toothache x 1 week to right upper molar & left lower molar. Teeth are chipped, denies injury.  States seen Monday, continuing to take amoxicillin but states ran out of oxycodone today "but it doesn't help at all for the pain".

## 2013-11-19 NOTE — ED Provider Notes (Signed)
CSN: [id number]     Arrival date & time 11/19/13  1429 History  This chart was scribed for non-physician practitioner Rhea Bleacher PA-C working with Celene Kras, MD by Leone Payor, ED Scribe. This patient was seen in room TR08C/TR08C and the patient's care was started at 1429.    Chief Complaint  Patient presents with  . Dental Pain    The history is provided by the patient. No language interpreter was used.    HPI Comments: Peggy [name] is a 32 y.o. female who presents to the Emergency Department complaining of ongoing, constant, unchanged right lower and left upper dental pain that began about 3 months ago. Pt states the pain is worse with eating cold or hot items. She has been taking 800 mg ibuprofen about 5-6 times per day. She has been seen multiple times in the ED for the same symptoms. She was given 5 mg Oxycodone after her last visit which she has taken without relief. She was given a dental block at one of her visits which gave her relief. She denies fever, nausea, vomiting.    Past Medical History  Diagnosis Date  . Fibroid   . Pregnancy induced hypertension    Past Surgical History  Procedure Laterality Date  . Dilation and curettage of uterus    . Tonsillectomy     Family History  Problem Relation Age of Onset  . Anesthesia problems Neg Hx   . Hypotension Neg Hx   . Malignant hyperthermia Neg Hx   . Pseudochol deficiency Neg Hx    History  Substance Use Topics  . Smoking status: Current Every Day Smoker -- 0.25 packs/day  . Smokeless tobacco: Never Used  . Alcohol Use: No   OB History   Grav Para Term Preterm Abortions TAB SAB Ect Mult Living   5 2 2  0 3 0 3 0 1 3     Review of Systems  Constitutional: Negative for fever.  HENT: Positive for dental problem. Negative for ear pain, facial swelling, sore throat and trouble swallowing.   Respiratory: Negative for shortness of breath and stridor.   Gastrointestinal: Negative for nausea and vomiting.   Musculoskeletal: Negative for neck pain.  Skin: Negative for color change.  Neurological: Negative for headaches.    Allergies  Tylenol  Home Medications   Current Outpatient Rx  Name  Route  Sig  Dispense  Refill  . amoxicillin (AMOXIL) 500 MG capsule   Oral   Take 1 capsule (500 mg total) by mouth 3 (three) times daily.   21 capsule   0   . amoxicillin (AMOXIL) 500 MG capsule   Oral   Take 1 capsule (500 mg total) by mouth 3 (three) times daily.   30 capsule   0   . ibuprofen (ADVIL,MOTRIN) 200 MG tablet   Oral   Take 800 mg by mouth every 6 (six) hours as needed for pain.         Marland Kitchen LORazepam (ATIVAN) 0.5 MG tablet   Oral   Take 0.5 mg by mouth as needed for anxiety.         Marland Kitchen oxyCODONE (ROXICODONE) 15 MG immediate release tablet   Oral   Take 7.5-15 mg by mouth every 6 (six) hours as needed for pain.         Marland Kitchen oxyCODONE (ROXICODONE) 5 MG immediate release tablet   Oral   Take 1 tablet (5 mg total) by mouth every 4 (four) hours as needed.  Take 1-2 tablets every 4-6 hours as needed for pain control   15 tablet   0    BP 131/87  Pulse 99  Temp(Src) 98.6 F (37 C) (Oral)  Resp 20  Ht 5\' 11"  (1.803 m)  Wt 187 lb (84.823 kg)  BMI 26.09 kg/m2  SpO2 100%  LMP 11/09/2013 Physical Exam  Nursing note and vitals reviewed. Constitutional: She appears well-developed and well-nourished.  HENT:  Head: Normocephalic and atraumatic.  Right Ear: Tympanic membrane, external ear and ear canal normal.  Left Ear: Tympanic membrane, external ear and ear canal normal.  Nose: Nose normal.  Mouth/Throat: Uvula is midline, oropharynx is clear and moist and mucous membranes are normal. No trismus in the jaw. Abnormal dentition. Dental caries present. No dental abscesses or uvula swelling. No tonsillar abscesses.    Right lower 1st molar and left upper 1st premolar are broken with surrounding gum tenderness.   Eyes: Conjunctivae are normal.  Neck: Normal range of  motion. Neck supple.  No neck swelling or Ludwig's angina  Cardiovascular: Normal rate.   Pulmonary/Chest: Effort normal.  Abdominal: She exhibits no distension.  Lymphadenopathy:    She has no cervical adenopathy.  Neurological: She is alert.  Skin: Skin is warm and dry.  Psychiatric: She has a normal mood and affect.    ED Course  Procedures   DIAGNOSTIC STUDIES: Oxygen Saturation is 100% on RA, normal by my interpretation.    COORDINATION OF CARE: 2:50 PM Will perform a dental block to relieve pain. Discussed treatment plan with pt at bedside and pt agreed to plan.   Labs Review Labs Reviewed - No data to display Imaging Review No results found.  EKG Interpretation   None      Patient seen and examined.    Vital signs reviewed and are as follows: Filed Vitals:   11/19/13 1440  BP: 131/87  Pulse: 99  Temp: 98.6 F (37 C)  Resp: 20   3:37 PM Dental block was performed in two areas. 1.23mL of 2% lidocaine with epi was combined with 1.70mL 0.5% bupivacaine with epi and an alveolar block was performed in BOTH areas. Injections made at base of tooth as well as the tooth immediately posterior and anterior to tooth. Adequate anesthesia was obtained. Minimal bleeding after injections. Patient tolerated procedure well with no immediate complications.   Counseled to f/u with dentist.   Patient counseled to take prescribed medications as directed, return with worsening facial or neck swelling, and to follow-up with their dentist as soon as possible.    MDM   1. Pain, dental    Multiple visits for dental pain. Dental block x 2 performed. Patient with toothache.  No gross abscess.  Exam unconcerning for Ludwig's angina or other deep tissue infection in neck.  Will treat with pain medicine.  Urged patient to follow-up with dentist.    I personally performed the services described in this documentation, which was scribed in my presence. The recorded information has been  reviewed and is accurate.   Renne Crigler, PA-C 11/19/13 1540

## 2013-11-19 NOTE — ED Notes (Signed)
C/o toothache x 1 week. Seen here for same on Monday

## 2013-11-26 ENCOUNTER — Encounter (HOSPITAL_BASED_OUTPATIENT_CLINIC_OR_DEPARTMENT_OTHER): Payer: Self-pay | Admitting: Emergency Medicine

## 2013-11-26 ENCOUNTER — Emergency Department (HOSPITAL_BASED_OUTPATIENT_CLINIC_OR_DEPARTMENT_OTHER)
Admission: EM | Admit: 2013-11-26 | Discharge: 2013-11-26 | Disposition: A | Discharge status: Home / Self Care | Payer: Medicaid Other | Attending: Emergency Medicine | Admitting: Emergency Medicine

## 2013-11-26 DIAGNOSIS — R600 Localized edema: Secondary | ICD-10-CM

## 2013-11-26 DIAGNOSIS — T85848A Pain due to other internal prosthetic devices, implants and grafts, initial encounter: Secondary | ICD-10-CM

## 2013-11-26 DIAGNOSIS — K089 Disorder of teeth and supporting structures, unspecified: Secondary | ICD-10-CM | POA: Insufficient documentation

## 2013-11-26 DIAGNOSIS — Z3202 Encounter for pregnancy test, result negative: Secondary | ICD-10-CM | POA: Insufficient documentation

## 2013-11-26 DIAGNOSIS — Z79899 Other long term (current) drug therapy: Secondary | ICD-10-CM | POA: Insufficient documentation

## 2013-11-26 DIAGNOSIS — F17200 Nicotine dependence, unspecified, uncomplicated: Secondary | ICD-10-CM | POA: Insufficient documentation

## 2013-11-26 DIAGNOSIS — G8918 Other acute postprocedural pain: Secondary | ICD-10-CM | POA: Insufficient documentation

## 2013-11-26 DIAGNOSIS — Z8742 Personal history of other diseases of the female genital tract: Secondary | ICD-10-CM | POA: Insufficient documentation

## 2013-11-26 DIAGNOSIS — R609 Edema, unspecified: Secondary | ICD-10-CM | POA: Insufficient documentation

## 2013-11-26 LAB — CBC WITH DIFFERENTIAL/PLATELET
Eosinophils Absolute: 0.3 10*3/uL (ref 0.0–0.7)
Eosinophils Relative: 5 % (ref 0–5)
HCT: 36.8 % (ref 36.0–46.0)
Hemoglobin: 12.6 g/dL (ref 12.0–15.0)
Lymphs Abs: 2.4 10*3/uL (ref 0.7–4.0)
MCH: 32.2 pg (ref 26.0–34.0)
Monocytes Relative: 9 % (ref 3–12)
Neutro Abs: 3.3 10*3/uL (ref 1.7–7.7)
Neutrophils Relative %: 51 % (ref 43–77)
Platelets: 237 10*3/uL (ref 150–400)
RBC: 3.91 MIL/uL (ref 3.87–5.11)
WBC: 6.6 10*3/uL (ref 4.0–10.5)

## 2013-11-26 LAB — COMPREHENSIVE METABOLIC PANEL
ALT: 17 U/L (ref 0–35)
Albumin: 3.9 g/dL (ref 3.5–5.2)
Alkaline Phosphatase: 46 U/L (ref 39–117)
BUN: 11 mg/dL (ref 6–23)
Chloride: 102 mEq/L (ref 96–112)
GFR calc Af Amer: >90 mL/min (ref >90)
Glucose, Bld: 92 mg/dL (ref 70–99)
Potassium: 3.8 mEq/L (ref 3.5–5.1)
Sodium: 138 mEq/L (ref 135–145)
Total Bilirubin: 0.2 mg/dL — ABNORMAL LOW (ref 0.3–1.2)
Total Protein: 6.6 g/dL (ref 6.0–8.3)

## 2013-11-26 LAB — URINALYSIS, ROUTINE W REFLEX MICROSCOPIC
Bilirubin Urine: NEGATIVE
Glucose, UA: NEGATIVE mg/dL
Hgb urine dipstick: NEGATIVE
Ketones, ur: NEGATIVE mg/dL
Protein, ur: NEGATIVE mg/dL
pH: 6 (ref 5.0–8.0)

## 2013-11-26 MED ORDER — TRAMADOL HCL 50 MG PO TABS
50.0000 mg | ORAL_TABLET | Freq: Once | ORAL | Status: AC
  Administered 2013-11-26: 50 mg via ORAL
  Filled 2013-11-26: qty 1

## 2013-11-26 MED ORDER — HYDROCODONE-IBUPROFEN 7.5-200 MG PO TABS: 1.0000 | ORAL_TABLET | Freq: Four times a day (QID) | ORAL | Status: DC | PRN

## 2013-11-26 MED ORDER — HYDROCHLOROTHIAZIDE 25 MG PO TABS: 25.0000 mg | ORAL_TABLET | Freq: Every day | ORAL | Status: DC

## 2013-11-26 MED ORDER — PENICILLIN V POTASSIUM 500 MG PO TABS: 500.0000 mg | ORAL_TABLET | Freq: Three times a day (TID) | ORAL | Status: DC

## 2013-11-26 NOTE — ED Notes (Signed)
Patient with three teeth that are bothering her.  Patient is scheduled to see and Orthodontist on Dec. 1st.  Patient is also c/o swelling to her lower extremities.

## 2013-11-26 NOTE — ED Provider Notes (Addendum)
CSN: [id number]     Arrival date & time 11/26/13  1535 History   First MD Initiated Contact with Patient 11/26/13 1552     Chief Complaint  Patient presents with  . Dental Pain   (Consider location/radiation/quality/duration/timing/severity/associated sxs/prior Treatment) HPI Comments: Patient is a 32 year old female presents with multiple complaints. She states that she is having swelling in both legs. She denies any injury or trauma. She denies any chest pain or shortness of breath. She is also complaining of dental pain. She has 2 teeth that have broken off on the upper jaw and one lower tooth that is decayed and painful. She's been seen in the ED multiple time for the same over the past 3 months. She tells me she has an appointment with a dentist on December 1. She denies any fevers or chills.  Patient is a 32 y.o. female presenting with tooth pain. The history is provided by the patient.  Dental Pain Location:  Upper and lower Quality:  Throbbing Severity:  Severe Onset quality:  Gradual Duration:  3 months Timing:  Constant Progression:  Worsening Chronicity:  Recurrent   Past Medical History  Diagnosis Date  . Fibroid   . Pregnancy induced hypertension    Past Surgical History  Procedure Laterality Date  . Dilation and curettage of uterus    . Tonsillectomy     Family History  Problem Relation Age of Onset  . Anesthesia problems Neg Hx   . Hypotension Neg Hx   . Malignant hyperthermia Neg Hx   . Pseudochol deficiency Neg Hx    History  Substance Use Topics  . Smoking status: Current Every Day Smoker -- 0.25 packs/day  . Smokeless tobacco: Never Used  . Alcohol Use: No   OB History   Grav Para Term Preterm Abortions TAB SAB Ect Mult Living   5 2 2  0 3 0 3 0 1 3     Review of Systems  All other systems reviewed and are negative.    Allergies  Tylenol  Home Medications   Current Outpatient Rx  Name  Route  Sig  Dispense  Refill  . amoxicillin  (AMOXIL) 500 MG capsule   Oral   Take 500 mg by mouth 3 (three) times daily.         Marland Kitchen ibuprofen (ADVIL,MOTRIN) 200 MG tablet   Oral   Take 800 mg by mouth every 6 (six) hours as needed for pain.         Marland Kitchen LORazepam (ATIVAN) 0.5 MG tablet   Oral   Take 0.5 mg by mouth 3 (three) times daily.          Marland Kitchen oxyCODONE (OXY IR/ROXICODONE) 5 MG immediate release tablet   Oral   Take 1-2 tablets (5-10 mg total) by mouth every 4 (four) hours as needed for severe pain. Take 1-2 tablets every 4-6 hours as needed for pain control   12 tablet   0    BP 137/80  Pulse 85  Temp(Src) 98.3 F (36.8 C) (Oral)  Resp 18  Ht 5\' 11"  (1.803 m)  Wt 187 lb (84.823 kg)  BMI 26.09 kg/m2  SpO2 100%  LMP 11/09/2013 Physical Exam  Nursing note and vitals reviewed. Constitutional: She is oriented to person, place, and time. She appears well-developed and well-nourished. No distress.  HENT:  Head: Normocephalic and atraumatic.  The 2 upper canine teeth are cracked and missing at the gumline. There is also one lower right first molar is decayed.  There is no gingival irritation and I am unable to identify an abscess or area of swelling.  Neck: Normal range of motion. Neck supple.  Cardiovascular: Normal rate and regular rhythm.  Exam reveals no gallop and no friction rub.   No murmur heard. Pulmonary/Chest: Effort normal and breath sounds normal. No respiratory distress. She has no wheezes.  Abdominal: Soft. Bowel sounds are normal. She exhibits no distension. There is no tenderness.  Musculoskeletal: Normal range of motion.  There is slight, nonpitting edema to the bilateral lower extremities. There is no calf tenderness and Homans sign is absent bilaterally. Dorsalis pedis pulses are easily palpable.  Neurological: She is alert and oriented to person, place, and time.  Skin: Skin is warm and dry. She is not diaphoretic.    ED Course  Procedures (including critical care time) Labs Review Labs  Reviewed  CBC WITH DIFFERENTIAL  COMPREHENSIVE METABOLIC PANEL  URINALYSIS, ROUTINE W REFLEX MICROSCOPIC  PREGNANCY, URINE   Imaging Review No results found.  EKG Interpretation    Date/Time:  Thursday November 26 2013 16:13:20 EST Ventricular Rate:  80 PR Interval:  148 QRS Duration: 88 QT Interval:  394 QTC Calculation: 454 R Axis:   79 Text Interpretation:  Normal sinus rhythm Cannot rule out Anterior infarct , age undetermined Abnormal ECG Confirmed by DELOS  MD, Keshauna Degraffenreid ([id number]) on 11/26/2013 4:18:19 PM            MDM  No diagnosis found. Patient is a 32 year old female presents with complaints of leg swelling and dental pain. Workup reveals no elevation of white count and normal renal function. Her EKG is unremarkable and I believe her swelling is related to dependent edema. I will prescribe her a low dose of hydrochlorothiazide for this and instruct her to elevate her feet.  She has several teeth which are broken off at the gumline but do not appear inflamed and there is no evidence for abscess. I have advised her to keep her appointment with her dentist, and in the meantime we'll prescribe an antibiotic and medication for pain.  While in the ED she was given tramadol.  She requested a pain medication other than tramadol as she reported to me this had caused her to "have a seizure in the past".  She also says that tramadol has "killed five people she knows."  Before discharge, I had a discussion with her about prescribing narcotics in the ED for chronic conditions.  I advised her that this was her 7th trip to the ED in the past 3 months for dental pain, and requested that future prescriptions for pain medications should come from either a dentist or her pcp.  She seemed fine with the plan when I left the exam room. Due to her tylenol allergy and aversion to tramadol, I wrote for vicoprofen and penicillin and put her up for discharge.  I felt as though this was a more than  adequate pain medication for the level of discomfort she appeared to be dealing with.      Geoffery Lyons, MD 11/26/13 1654   When being giving the discharge instructions and prescriptions, the patient informed the nurse she was unhappy with my care and wanted to speak with the supervisor.  She stated that I made derogatory remarks about her, and was disrespectful toward her and apparently made quite a scene.  In reality, I simply explained to the patient the reasons why chronic dental pain needs to be treated by either a dentist or  a pcp, and the general reason why I was reluctant to provide her with another oxycontin prescription.  She apparently took this as a personal insult, accusing me of treating her "like a drug addict" and stating that I "called her a loser".    She was eventually discharged, informing the nurse she wasn't going to get "that shit" filled (referring to the vicoprofen).  Apparently she wanted another prescription for oxycontin and became unhappy when I wouldn't write for it.   Geoffery Lyons, MD 11/26/13 [phone]

## 2013-12-18 ENCOUNTER — Emergency Department (HOSPITAL_COMMUNITY)
Admission: EM | Admit: 2013-12-18 | Discharge: 2013-12-19 | Disposition: A | Discharge status: Home / Self Care | Payer: Medicaid Other | Attending: Emergency Medicine | Admitting: Emergency Medicine

## 2013-12-18 ENCOUNTER — Encounter (HOSPITAL_COMMUNITY): Payer: Self-pay | Admitting: Emergency Medicine

## 2013-12-18 DIAGNOSIS — Z79899 Other long term (current) drug therapy: Secondary | ICD-10-CM | POA: Insufficient documentation

## 2013-12-18 DIAGNOSIS — Z8742 Personal history of other diseases of the female genital tract: Secondary | ICD-10-CM | POA: Insufficient documentation

## 2013-12-18 DIAGNOSIS — Z888 Allergy status to other drugs, medicaments and biological substances status: Secondary | ICD-10-CM | POA: Insufficient documentation

## 2013-12-18 DIAGNOSIS — K089 Disorder of teeth and supporting structures, unspecified: Secondary | ICD-10-CM | POA: Insufficient documentation

## 2013-12-18 DIAGNOSIS — F17200 Nicotine dependence, unspecified, uncomplicated: Secondary | ICD-10-CM | POA: Insufficient documentation

## 2013-12-18 DIAGNOSIS — K029 Dental caries, unspecified: Secondary | ICD-10-CM | POA: Insufficient documentation

## 2013-12-18 DIAGNOSIS — K0889 Other specified disorders of teeth and supporting structures: Secondary | ICD-10-CM

## 2013-12-18 DIAGNOSIS — R630 Anorexia: Secondary | ICD-10-CM | POA: Insufficient documentation

## 2013-12-18 NOTE — ED Notes (Signed)
Pt reports having oral surgery performed a week ago and has had complications from it. States that pain is "unbearable". Has appt with oral surgeon to have packing removed this coming Tuesday but is out of pain medicine, oxycodone 5mg  and needs more for the pain.

## 2013-12-18 NOTE — ED Provider Notes (Signed)
CSN: [id number]     Arrival date & time 12/18/13  2139 History   First MD Initiated Contact with Patient 12/18/13 2320     Chief Complaint  Patient presents with  . Dental Pain   (Consider location/radiation/quality/duration/timing/severity/associated sxs/prior Treatment) HPI Comments: Patient is a 32 year old female who presents for diffuse dental pain. She states that symptoms have been ongoing for the past week after oral surgery performed by Dr. Lovell Sheehan. Patient states that she has pain because of a "dry socket" that developed post oral surgery. Pain has been constant and gradually worsening. She has been taking ibuprofen without relief. Pain is worse with palpation and chewing. She denies any inability to swallow, facial swelling, drooling, neck stiffness, and shortness of breath.  Patient has been seen numerous times at the emergency department for dental pain complaints.  Patient is a 32 y.o. female presenting with tooth pain. The history is provided by the patient. No language interpreter was used.  Dental Pain Associated symptoms: no drooling     Past Medical History  Diagnosis Date  . Fibroid   . Pregnancy induced hypertension    Past Surgical History  Procedure Laterality Date  . Dilation and curettage of uterus    . Tonsillectomy     Family History  Problem Relation Age of Onset  . Anesthesia problems Neg Hx   . Hypotension Neg Hx   . Malignant hyperthermia Neg Hx   . Pseudochol deficiency Neg Hx    History  Substance Use Topics  . Smoking status: Current Every Day Smoker -- 0.25 packs/day  . Smokeless tobacco: Never Used  . Alcohol Use: No   OB History   Grav Para Term Preterm Abortions TAB SAB Ect Mult Living   5 2 2  0 3 0 3 0 1 3     Review of Systems  Constitutional: Positive for appetite change ("can't eat because of pain").  HENT: Positive for dental problem. Negative for drooling and trouble swallowing.   Respiratory: Negative for shortness of  breath.   Gastrointestinal: Negative for vomiting.  Musculoskeletal: Negative for neck stiffness.    Allergies  Tramadol; Tylenol; and Ibuprofen  Home Medications   Current Outpatient Rx  Name  Route  Sig  Dispense  Refill  . ibuprofen (ADVIL,MOTRIN) 200 MG tablet   Oral   Take 800 mg by mouth every 6 (six) hours as needed for pain.         Marland Kitchen LORazepam (ATIVAN) 0.5 MG tablet   Oral   Take 0.5 mg by mouth 3 (three) times daily.          Marland Kitchen oxyCODONE (OXY IR/ROXICODONE) 5 MG immediate release tablet   Oral   Take 5 mg by mouth every 4 (four) hours as needed for severe pain.         . Ibuprofen-Famotidine (DUEXIS) 800-26.6 MG TABS   Oral   Take 1 tablet by mouth every 8 (eight) hours as needed.   30 tablet   0    BP 158/101  Pulse 105  Temp(Src) 98.4 F (36.9 C) (Oral)  Resp 20  Wt 203 lb 8 oz (92.307 kg)  SpO2 100%  LMP 12/10/2013  Physical Exam  Nursing note and vitals reviewed. Constitutional: She is oriented to person, place, and time. She appears well-developed and well-nourished. No distress.  HENT:  Head: Normocephalic and atraumatic.  Right Ear: Tympanic membrane, external ear and ear canal normal. No mastoid tenderness.  Left Ear: Tympanic membrane, external ear and  ear canal normal. No mastoid tenderness.  Nose: Nose normal.  Mouth/Throat: Uvula is midline, oropharynx is clear and moist and mucous membranes are normal. No oral lesions. No trismus in the jaw. Abnormal dentition. Dental caries present. No dental abscesses or uvula swelling.    Eyes: Conjunctivae and EOM are normal. Pupils are equal, round, and reactive to light. No scleral icterus.  Neck: Normal range of motion. Neck supple.  Pulmonary/Chest: Effort normal. No respiratory distress.  Musculoskeletal: Normal range of motion.  Lymphadenopathy:    She has no cervical adenopathy.  Neurological: She is alert and oriented to person, place, and time.  Skin: Skin is warm and dry. No rash  noted. She is not diaphoretic. No erythema. No pallor.  Psychiatric: She has a normal mood and affect. Her behavior is normal.    ED Course  Dental Date/Time: 12/19/2013 12:30 AM Performed by: Antony Madura Authorized by: Antony Madura Consent: Verbal consent obtained. written consent not obtained. The procedure was performed in an emergent situation. Risks and benefits: risks, benefits and alternatives were discussed Consent given by: patient Patient understanding: patient states understanding of the procedure being performed Patient consent: the patient's understanding of the procedure matches consent given Procedure consent: procedure consent matches procedure scheduled Relevant documents: relevant documents present and verified Test results: test results available and properly labeled Site marked: the operative site was marked Imaging studies: imaging studies available Required items: required blood products, implants, devices, and special equipment available Patient identity confirmed: verbally with patient and arm band Local anesthesia used: yes Anesthesia: nerve block Local anesthetic: bupivacaine 0.25% with epinephrine Anesthetic total: 3 ml Patient sedated: no Patient tolerance: Patient tolerated the procedure well with no immediate complications. Comments: Middle superior alveolar block x 2 (right and left) for dental pain; 1.40mL used for each block   (including critical care time) Labs Review Labs Reviewed - No data to display  Imaging Review No results found.  EKG Interpretation   None       MDM   1. Dentalgia    Patient presents today for dentalgia following oral surgery. She has been seen numerous time for dental pain complaints. Patient afebrile, nontoxic, and hemodynamically stable. No facial swelling, oral bleeding, trismus, or stridor. No physical exam findings concerning for Ludwig's angina.  Patient offered dental block x 2 which she initially  accepted. When attempting to block individual teeth for further relief patient becomes enraged and throws a tissue box at the door yelling, "Fuck this. Y'all have labeled me as a pill popper." Patient has been firmly told that narcotics can only be prescribed to her by her oral surgeon that performed the procedure. She has been advised to continue ibuprofen for pain control as she has been doing.  Patient left, cursing under her breath, prior to receiving discharge papers.    Antony Madura, PA-C 12/19/13 [phone]

## 2013-12-18 NOTE — ED Notes (Signed)
Pt. reports dental pain this evening , pt. stated she had oral surgery last Thursday .

## 2013-12-19 MED ORDER — IBUPROFEN-FAMOTIDINE 800-26.6 MG PO TABS: 1.0000 | ORAL_TABLET | Freq: Three times a day (TID) | ORAL | Status: DC | PRN

## 2013-12-19 NOTE — ED Provider Notes (Signed)
Medical screening examination/treatment/procedure(s) were performed by non-physician practitioner and as supervising physician I was immediately available for consultation/collaboration.    Klara Stjames M Shaquella Stamant, MD 12/19/13 0712 

## 2014-02-12 ENCOUNTER — Emergency Department (HOSPITAL_COMMUNITY): Payer: Medicaid Other

## 2014-02-12 ENCOUNTER — Emergency Department (HOSPITAL_COMMUNITY)
Admission: EM | Admit: 2014-02-12 | Discharge: 2014-02-12 | Disposition: A | Discharge status: Home / Self Care | Payer: Medicaid Other | Attending: Emergency Medicine | Admitting: Emergency Medicine

## 2014-02-12 ENCOUNTER — Encounter (HOSPITAL_COMMUNITY): Payer: Self-pay | Admitting: Emergency Medicine

## 2014-02-12 DIAGNOSIS — Y9289 Other specified places as the place of occurrence of the external cause: Secondary | ICD-10-CM | POA: Insufficient documentation

## 2014-02-12 DIAGNOSIS — S39012A Strain of muscle, fascia and tendon of lower back, initial encounter: Secondary | ICD-10-CM

## 2014-02-12 DIAGNOSIS — Y9389 Activity, other specified: Secondary | ICD-10-CM | POA: Insufficient documentation

## 2014-02-12 DIAGNOSIS — S335XXA Sprain of ligaments of lumbar spine, initial encounter: Secondary | ICD-10-CM | POA: Insufficient documentation

## 2014-02-12 DIAGNOSIS — S8002XA Contusion of left knee, initial encounter: Secondary | ICD-10-CM

## 2014-02-12 DIAGNOSIS — S8000XA Contusion of unspecified knee, initial encounter: Secondary | ICD-10-CM | POA: Insufficient documentation

## 2014-02-12 DIAGNOSIS — Z79899 Other long term (current) drug therapy: Secondary | ICD-10-CM | POA: Insufficient documentation

## 2014-02-12 DIAGNOSIS — Z8742 Personal history of other diseases of the female genital tract: Secondary | ICD-10-CM | POA: Insufficient documentation

## 2014-02-12 DIAGNOSIS — F17200 Nicotine dependence, unspecified, uncomplicated: Secondary | ICD-10-CM | POA: Insufficient documentation

## 2014-02-12 DIAGNOSIS — R269 Unspecified abnormalities of gait and mobility: Secondary | ICD-10-CM | POA: Insufficient documentation

## 2014-02-12 DIAGNOSIS — W010XXA Fall on same level from slipping, tripping and stumbling without subsequent striking against object, initial encounter: Secondary | ICD-10-CM | POA: Insufficient documentation

## 2014-02-12 MED ORDER — METHOCARBAMOL 500 MG PO TABS: 1000.0000 mg | ORAL_TABLET | Freq: Four times a day (QID) | ORAL | Status: DC

## 2014-02-12 MED ORDER — OXYCODONE HCL 5 MG PO TABS
5.0000 mg | ORAL_TABLET | Freq: Once | ORAL | Status: AC
  Administered 2014-02-12: 5 mg via ORAL
  Filled 2014-02-12: qty 1

## 2014-02-12 NOTE — Discharge Instructions (Signed)
Please read and follow all provided instructions.  Your diagnoses today include:  1. Contusion of knee, left   2. Lumbar strain     Tests performed today include:  Vital signs - see below for your results today  Medications prescribed:   Robaxin (methocarbamol) - muscle relaxer medication  DO NOT drive or perform any activities that require you to be awake and alert because this medicine can make you drowsy.   Take any prescribed medications only as directed.  Home care instructions:   Follow any educational materials contained in this packet  Please rest, use ice or heat on your back for the next several days  Do not lift, push, pull anything more than 10 pounds for the next week  Follow-up instructions: Please follow-up with your primary care provider in the next 1 week for further evaluation of your symptoms. If you do not have a primary care doctor -- see below for referral information.   Return instructions:  SEEK IMMEDIATE MEDICAL ATTENTION IF YOU HAVE:  New numbness, tingling, weakness, or problem with the use of your arms or legs  Severe back pain not relieved with medications  Loss control of your bowels or bladder  Increasing pain in any areas of the body (such as chest or abdominal pain)  Shortness of breath, dizziness, or fainting.   Worsening nausea (feeling sick to your stomach), vomiting, fever, or sweats  Any other emergent concerns regarding your health   Additional Information:  Your vital signs today were: BP 122/86   Pulse 101   Temp(Src) 97.5 F (36.4 C) (Oral)   Resp 18   SpO2 100%   LMP 01/29/2014 If your blood pressure (BP) was elevated above 135/85 this visit, please have this repeated by your doctor within one month. --------------  Emergency Department Resource Guide 1) Find a Doctor and Pay Out of Pocket Although you won't have to find out who is covered by your insurance plan, it is a good idea to ask around and get  recommendations. You will then need to call the office and see if the doctor you have chosen will accept you as a new patient and what types of options they offer for patients who are self-pay. Some doctors offer discounts or will set up payment plans for their patients who do not have insurance, but you will need to ask so you aren't surprised when you get to your appointment.  2) Contact Your Local Health Department Not all health departments have doctors that can see patients for sick visits, but many do, so it is worth a call to see if yours does. If you don't know where your local health department is, you can check in your phone book. The CDC also has a tool to help you locate your state's health department, and many state websites also have listings of all of their local health departments.  3) Find a [facility] If your illness is not likely to be very severe or complicated, you may want to try a walk in clinic. These are popping up all over the country in pharmacies, drugstores, and shopping centers. They're usually staffed by nurse practitioners or physician assistants that have been trained to treat common illnesses and complaints. They're usually fairly quick and inexpensive. However, if you have serious medical issues or chronic medical problems, these are probably not your best option.  No Primary Care Doctor: - Call Health Connect at  [phone] - they can help you locate a primary  care doctor that  accepts your insurance, provides certain services, etc. - Physician Referral Service- [phone]  Chronic Pain Problems: Organization         Address  Phone   Notes  [facility]  [phone] Patients need to be referred by their primary care doctor.   Medication Assistance: Organization         Address  Phone   Notes  [facility] Medication [facility] [address] [place], [place] [address] [phone] --Must be a resident of  [facility] -- Must have NO insurance coverage whatsoever (no Medicaid/ Medicare, etc.) -- The pt. MUST have a primary care doctor that directs their care regularly and follows them in the community   [facility]ist  [phone]   [employer]  [phone]    Agencies that provide inexpensive medical care: Organization         Address  Phone   Notes  [place]  [phone]   [facility]    [phone]   [facility] [place], [place] [address] [phone]   [place] [address], [place] [phone]   Planned Parenthood    [phone]   [facility]    [phone]   [place] and [place] [address], [place] Phone:  [phone], Fax:  [phone] Hours of Operation:  9 am - 6 pm, M-F.  Also accepts Medicaid/Medicare and self-pay.  [facility] for [place] [address], [address], [place] Phone: [phone], Fax: [phone]. Hours of Operation:  8:30 am - 5:30 pm, M-F.  Also accepts Medicaid and self-pay.  [facility] [place] [address] [place] Phone: [phone]   [place], [place], [place] [phone], Ext. [phone] Mondays & Thursdays: 7-9 AM.  First 15 patients are seen on a first come, first serve basis.    [place] Providers:  Organization         Address  Phone   Notes  [facility] [address], [place] [phone] Also accepts self-pay patients.  [facility] [address], [place]  [phone]   [place], [address], [place] [phone]   [facility] [address] [place] [phone]   Lucianne Lei [address], [place]   [phone] Only accepts [place] Access  [place] patients after they have their name applied to their card.   Self-Pay (no insurance) in [facility]:  Organization         Address  Phone   Notes  Sickle Cell Patients, [facility] Internal Medicine [place] [phone]   [facility] Urgent Care [place] [phone]   [facility] [place]  [place], [place], [place] [phone]   [facility]/Dr. Osei-Bonsu  [address] [place] [address], [place] [phone] Phone number for both [place] and [place] locations is the same.  Urgent Medical and [facility] [address], [place] [phone]   [facility] [place] [address], [place] or [address] [phone]  [facility] [place] [phone], phone; [phone], fax Sees patients 1st and 3rd Saturday of every month.  Must not qualify for public or private insurance (i.e. Medicaid, Medicare, [place] Health Choice, Veterans' Benefits)  Household income should be no more than 200% of the poverty level The clinic cannot treat you if you are pregnant or think you are pregnant  Sexually transmitted diseases are not treated at the clinic.    Dental Care: Organization         Address  Phone  Notes  [facility] Department of [facility] [place] [phone] Accepts children up to age 55 who are enrolled in [place] or [place]; pregnant women with a Medicaid card; and children who have applied for Medicaid or [place] Health Choice, but were declined, whose parents can pay a reduced fee at time of service.  [facility] Department of [facility]  [address], [place] [phone] Accepts children up to age 77 who are enrolled in [place] or [place]; pregnant women with a Medicaid  card; and children who have applied for Medicaid or [place] Health Choice, but were declined, whose parents can pay a reduced fee at time of service.  [place] Adult Dental Access PROGRAM  [place] [phone] Patients are seen by appointment only. Walk-ins are not accepted. [place] will see patients 72 years of age and older. Monday - Tuesday (8am-5pm) Most Wednesdays (8:30-5pm) $30 per visit, cash only  [facility] Adult Dental Access PROGRAM  [address], [facility] [phone] Patients are seen by appointment only. Walk-ins are not accepted. [place] will see patients 58 years of age and older. One Wednesday Evening (Monthly: Volunteer Based).  $30 per visit, cash only  [place]  [phone] for adults; Children under age 38, call Graduate Pediatric Dentistry at [phone]. Children aged 60-14, please call [phone] to request a pediatric application.  Dental services are provided in all areas of dental care including fillings, crowns and bridges, complete and partial dentures, implants, gum treatment, root canals, and extractions. Preventive care is also provided. Treatment is provided to both adults and children. Patients are selected via a lottery and there is often a waiting list.   [facility] [address], [place]  [phone] [email or web address]   [facility] [address] [place], [place] [phone], Ext. [phone] Second and Fourth Thursday of each month, opens at 6:30 AM; Clinic ends at 9 AM.  Patients are seen on a first-come first-served basis, and a limited number are seen during each clinic.   [facility]  [address] Hillard Danker [place], [place] [phone]   Eligibility Requirements You must have lived in [place], [place], or [place] counties for at least the last three months.   You cannot be eligible for state or federal sponsored [employer],  including [employer], [place], or [employer].   You generally cannot be eligible for healthcare insurance through your employer.    How to apply: Eligibility screenings are held every Tuesday and Wednesday afternoon from 1:00 pm until 4:00 pm. You do not need an appointment for the interview!  [facility] [address] [place], [place]   [place]  [place] Department  [place]  Department  [phone]    Behavioral Health Resources in the Community: Intensive Outpatient Programs Organization         Address  Phone  Notes  [place]. [address], [place], [place] [phone]   [facility] [address], [place], [place]   ADS: Alcohol & Drug Svcs [address], [place], [place]   [place] [address],  [place], [place] or [phone]   Substance Abuse Resources Organization         Address  Phone  Notes  Alcohol and Drug Services  [phone]   [place]  [phone]   [facility] [place]   Chinita Pester  [phone]   Residential & Outpatient Substance Abuse Program  [phone]   Psychological Services Organization         Address  Phone  Notes  [facility]  [place]  [phone]   [place] [address], [place] or [phone]    Mobile Crisis Teams Organization         Address  Phone  Notes  [facility], Mobile Crisis Care Unit  [phone]   [facility] Psychotherapeutic Services  [address] [place], [place]   [place] [address] [place] [phone]    Self-Help/Support Groups Organization         Address  Phone             Notes  [place].  of [place] - variety of support groups  [place] Call for more information  Narcotics Anonymous (NA), [facility] [address], [employer] [place]  2 meetings at this location   Special educational needs teacher         Address  Phone  Notes  ASAP Residential Treatment [place],    [place]  [phone]   [facility]  [address] [place] [address], [place], [place]   [place], [place] [phone] Admissions: 8am-3pm M-F  Incentives Substance [place] [address]    [place], [place] [phone]   [facility] [address] [place], [place], [place]   [facility] [address],  [place], [place]   Insight Programs - Intensive Outpatient [address], [place], [place]   [facility] ([place] [address] [place].,  [place], [place] [phone] or [phone]   [facility]idential Treatment Services (RTS) [address] [place], [place] Accepts Medicaid  [facility] [place] [address],  [place] [phone] Substance Abuse/Addiction Treatment   [facility] Organization         Address  Phone  Notes  [facility]  [phone]   Domenic Schwab, PhD [address] Arlis Porta [place], [place]   [phone] or [phone]   [place]aves [place], [place] [phone]   [place] [address] [place], [place] [phone] Insurance/Medicaid/sponsorship through [employer] and Families [address] [place]                                    [place], [place] [phone] [place]  [place], [place] [phone]    Dr. Adele Schilder  [phone]   [facility] Dept. 1) [address], [place] 2)  [place] 3)  [place] [address], [place] [phone]  [phone]   [place] [phone] or [phone] (After Hours)

## 2014-02-12 NOTE — ED Provider Notes (Signed)
CSN: [id number]     Arrival date & time 02/12/14  1502 History  This chart was scribed for non-physician practitioner Alecia Lemming, PA-C working with [place], DO by Eston Mould, ED Scribe. This patient was seen in room TR09C/TR09C and the patient's care was started at 4:23 PM .   Chief Complaint  Patient presents with  . Fall   The history is provided by the patient. No language interpreter was used.   HPI Comments: Peggy [name] is a 33 y.o. female who presents to the Emergency Department complaining of lower back pain, worse on the L, with radiating pain down LE due to fall that occurred 2 hours ago. Pt states she fell out of the bath tub 2 hours ago. Pt states she slipped due to being on slippery surface. Pt states she took Oxycodone (rx when she had a dental procedure) this morning without relief. Requested an x-ray. Pt states she is able to walk but states she has pain. Pt states she feels her L knee cap "rolled". Pt states she has a hx of cortisone shots to L knee due to sports injuries.Pt denies neck pain.   Past Medical History  Diagnosis Date  . Fibroid   . Pregnancy induced hypertension    Past Surgical History  Procedure Laterality Date  . Dilation and curettage of uterus    . Tonsillectomy     Family History  Problem Relation Age of Onset  . Anesthesia problems Neg Hx   . Hypotension Neg Hx   . Malignant hyperthermia Neg Hx   . Pseudochol deficiency Neg Hx    History  Substance Use Topics  . Smoking status: Current Every Day Smoker -- 0.25 packs/day  . Smokeless tobacco: Never Used  . Alcohol Use: No   OB History   Grav Para Term Preterm Abortions TAB SAB Ect Mult Living   5 2 2  0 3 0 3 0 1 3     Review of Systems  Constitutional: Positive for activity change. Negative for fever and unexpected weight change.  Gastrointestinal: Negative for constipation.       Negative for fecal incontinence.   Genitourinary:       Negative for  urinary incontinence or retention.  Musculoskeletal: Positive for arthralgias, back pain and gait problem. Negative for joint swelling, neck pain and neck stiffness.  Skin: Negative for wound.  Neurological: Negative for weakness and numbness.       Denies saddle paresthesias.   Allergies  Tramadol; Tylenol; and Ibuprofen  Home Medications   Current Outpatient Rx  Name  Route  Sig  Dispense  Refill  . ibuprofen (ADVIL,MOTRIN) 200 MG tablet   Oral   Take 800 mg by mouth every 6 (six) hours as needed for pain.         . Ibuprofen-Famotidine (DUEXIS) 800-26.6 MG TABS   Oral   Take 1 tablet by mouth every 8 (eight) hours as needed.   30 tablet   0   . LORazepam (ATIVAN) 0.5 MG tablet   Oral   Take 0.5 mg by mouth 3 (three) times daily.          Marland Kitchen oxyCODONE (OXY IR/ROXICODONE) 5 MG immediate release tablet   Oral   Take 5 mg by mouth every 4 (four) hours as needed for severe pain.          BP 122/86  Pulse 101  Temp(Src) 97.5 F (36.4 C) (Oral)  Resp 18  SpO2 100%  Physical Exam  Nursing note and vitals reviewed. Constitutional: She appears well-developed and well-nourished. No distress.  HENT:  Head: Normocephalic and atraumatic.  Eyes: EOM are normal. Pupils are equal, round, and reactive to light.  Neck: Normal range of motion. Neck supple. No tracheal deviation present.  Cardiovascular: Normal rate.  Exam reveals no decreased pulses.   Normal pedal pulses.  Pulmonary/Chest: Effort normal. No respiratory distress.  Musculoskeletal: Normal range of motion. She exhibits tenderness. She exhibits no edema.  Tenderness over the L patella. L lumbar lower Paraspinous spasm.  Generalized tenderness of the L hip. Normal ROM of hip and knee.   Neurological: She is alert. No sensory deficit.  Normal sensation.  Skin: Skin is warm and dry.  Psychiatric: She has a normal mood and affect. Her behavior is normal.    ED Course  Procedures  DIAGNOSTIC STUDIES: Oxygen  Saturation is 100% on RA, normal by my interpretation.    COORDINATION OF CARE: 4:27 PM-Discussed treatment plan which includes X-ray and administer pain medication while in ED. Pt agreed to plan.   5:10 PM-Informed pt of radiology findings. Will discharge pt with Robaxin. Pt agreed to plan.  Labs Review Labs Reviewed - No data to display Imaging Review Dg Hip Complete Left  02/12/2014   CLINICAL DATA:  Fall.  Left hip pain.  EXAM: LEFT HIP - COMPLETE 2+ VIEW  COMPARISON:  None.  FINDINGS: There is no evidence of hip fracture or dislocation. There is no evidence of arthropathy or other focal bone abnormality.  IMPRESSION: Negative.   Electronically Signed   By: Rolm Baptise M.D.   On: 02/12/2014 16:52   Dg Knee Complete 4 Views Left  02/12/2014   CLINICAL DATA:  Fall.  Left knee pain.  EXAM: LEFT KNEE - COMPLETE 4+ VIEW  COMPARISON:  None.  FINDINGS: There is no evidence of fracture, dislocation, or joint effusion. There is no evidence of arthropathy or other focal bone abnormality. Soft tissues are unremarkable.  IMPRESSION: Negative.   Electronically Signed   By: Rolm Baptise M.D.   On: 02/12/2014 16:52    EKG Interpretation   None      Vital signs reviewed and are as follows: Filed Vitals:   02/12/14 1516  BP: 122/86  Pulse: 101  Temp: 97.5 F (36.4 C)  Resp: 18   5:14 PM Patient counseled on proper use of muscle relaxant medication.  They were told not to drink alcohol, drive any vehicle, or do any dangerous activities while taking this medication.  Patient verbalized understanding.  Patient was counseled on RICE protocol and told to rest injury, use ice for no longer than 15 minutes every hour, compress the area, and elevate above the level of their heart as much as possible to reduce swelling.  Questions answered.  Patient verbalized understanding.     MDM   Final diagnoses:  Contusion of knee, left  Lumbar strain   Patient with back/hip and knee pain after fall.  Imaging negative. No neurological deficits. Patient is ambulatory. No warning symptoms of back pain including: loss of bowel or bladder control, night sweats, waking from sleep with back pain, unexplained fevers or weight loss, h/o cancer, IVDU, recent trauma. No concern for cauda equina, epidural abscess, or other serious cause of back pain. Conservative measures such as rest, ice/heat indicated with PCP follow-up if no improvement with conservative management.   I personally performed the services described in this documentation, which was scribed in my presence. The  recorded information has been reviewed and is accurate.    Carlisle Cater, PA-C 02/12/14 1715

## 2014-02-12 NOTE — ED Notes (Signed)
Pt in s/p fall, states she was in the shower today and her left knee gave out and twisted and she fell out of the shower and hit her left lower back, c.o pain to both knee and back areas, ambulatory to room

## 2014-02-13 ENCOUNTER — Emergency Department (HOSPITAL_COMMUNITY): Payer: Medicaid Other

## 2014-02-13 ENCOUNTER — Emergency Department (HOSPITAL_COMMUNITY)
Admission: EM | Admit: 2014-02-13 | Discharge: 2014-02-13 | Disposition: A | Discharge status: Home / Self Care | Payer: Medicaid Other | Attending: Emergency Medicine | Admitting: Emergency Medicine

## 2014-02-13 ENCOUNTER — Encounter (HOSPITAL_COMMUNITY): Payer: Self-pay | Admitting: Emergency Medicine

## 2014-02-13 DIAGNOSIS — Y939 Activity, unspecified: Secondary | ICD-10-CM | POA: Insufficient documentation

## 2014-02-13 DIAGNOSIS — S60559A Superficial foreign body of unspecified hand, initial encounter: Secondary | ICD-10-CM | POA: Insufficient documentation

## 2014-02-13 DIAGNOSIS — S99919A Unspecified injury of unspecified ankle, initial encounter: Secondary | ICD-10-CM

## 2014-02-13 DIAGNOSIS — S61419A Laceration without foreign body of unspecified hand, initial encounter: Secondary | ICD-10-CM

## 2014-02-13 DIAGNOSIS — S61509A Unspecified open wound of unspecified wrist, initial encounter: Secondary | ICD-10-CM | POA: Insufficient documentation

## 2014-02-13 DIAGNOSIS — F17200 Nicotine dependence, unspecified, uncomplicated: Secondary | ICD-10-CM | POA: Insufficient documentation

## 2014-02-13 DIAGNOSIS — Y92009 Unspecified place in unspecified non-institutional (private) residence as the place of occurrence of the external cause: Secondary | ICD-10-CM | POA: Insufficient documentation

## 2014-02-13 DIAGNOSIS — Z8742 Personal history of other diseases of the female genital tract: Secondary | ICD-10-CM | POA: Insufficient documentation

## 2014-02-13 DIAGNOSIS — S99929A Unspecified injury of unspecified foot, initial encounter: Secondary | ICD-10-CM

## 2014-02-13 DIAGNOSIS — S61409A Unspecified open wound of unspecified hand, initial encounter: Secondary | ICD-10-CM | POA: Insufficient documentation

## 2014-02-13 DIAGNOSIS — S8990XA Unspecified injury of unspecified lower leg, initial encounter: Secondary | ICD-10-CM | POA: Insufficient documentation

## 2014-02-13 DIAGNOSIS — Z79899 Other long term (current) drug therapy: Secondary | ICD-10-CM | POA: Insufficient documentation

## 2014-02-13 DIAGNOSIS — W1809XA Striking against other object with subsequent fall, initial encounter: Secondary | ICD-10-CM | POA: Insufficient documentation

## 2014-02-13 DIAGNOSIS — X500XXA Overexertion from strenuous movement or load, initial encounter: Secondary | ICD-10-CM | POA: Insufficient documentation

## 2014-02-13 MED ORDER — CEPHALEXIN 500 MG PO CAPS: 500.0000 mg | ORAL_CAPSULE | Freq: Four times a day (QID) | ORAL | Status: DC

## 2014-02-13 MED ORDER — CEPHALEXIN 500 MG PO CAPS
500.0000 mg | ORAL_CAPSULE | Freq: Once | ORAL | Status: AC
  Administered 2014-02-13: 500 mg via ORAL
  Filled 2014-02-13: qty 1

## 2014-02-13 MED ORDER — OXYCODONE HCL 5 MG PO TABS
5.0000 mg | ORAL_TABLET | Freq: Once | ORAL | Status: AC
  Administered 2014-02-13: 5 mg via ORAL
  Filled 2014-02-13: qty 1

## 2014-02-13 MED ORDER — FENTANYL CITRATE 0.05 MG/ML IJ SOLN
50.0000 ug | Freq: Once | INTRAMUSCULAR | Status: AC
  Administered 2014-02-13: 50 ug via NASAL
  Filled 2014-02-13: qty 2

## 2014-02-13 MED ORDER — OXYCODONE HCL 5 MG PO TABS: 5.0000 mg | ORAL_TABLET | ORAL | Status: DC | PRN

## 2014-02-13 NOTE — ED Provider Notes (Signed)
Medical screening examination/treatment/procedure(s) were performed by non-physician practitioner and as supervising physician I was immediately available for consultation/collaboration.  EKG Interpretation   None         Delice Bison Tamu Golz, DO 02/13/14 0003

## 2014-02-13 NOTE — ED Notes (Addendum)
Pt reports falling at 0200 this morning while going outside to smoke. Pt reports being seen yesterday at [facility] for back pain that made her fall. Pt reports that when she fell this morning, she fell into a glass table and is concerned about glass being inside her right hand and right knee. Pt has injury to right medial wrist and right knee. Pt is ambulatory to triage. Pt has a heart rate of 118 in triage. Pt is A/O x4 and in NAD. Pt is unaware of last tetanus vaccine.

## 2014-02-13 NOTE — ED Provider Notes (Signed)
CSN: [id number]     Arrival date & time 02/13/14  1316 History   First MD Initiated Contact with Patient 02/13/14 1330     Chief Complaint  Patient presents with  . Fall  . Hand Pain  . Knee Pain      HPI  Patient seen yesterday.  Fell and twisted her knee.  Normal x-rays.  Was at home or with swimming.  It gives off.  Struck her right knee.  Her right hand against a glass table.  She states the table shattered.  She is concerned she may have glass in her hand.  Patient has normal Feeling.  No pulsatile bleeding.  Some peritoneum.  Primary concern is laceration to her right wrist and hand.  Past Medical History  Diagnosis Date  . Fibroid   . Pregnancy induced hypertension    Past Surgical History  Procedure Laterality Date  . Dilation and curettage of uterus    . Tonsillectomy     Family History  Problem Relation Age of Onset  . Anesthesia problems Neg Hx   . Hypotension Neg Hx   . Malignant hyperthermia Neg Hx   . Pseudochol deficiency Neg Hx    History  Substance Use Topics  . Smoking status: Current Every Day Smoker -- 0.25 packs/day  . Smokeless tobacco: Never Used  . Alcohol Use: No   OB History   Grav Para Term Preterm Abortions TAB SAB Ect Mult Living   5 2 2  0 3 0 3 0 1 3     Review of Systems  Musculoskeletal:       Right knee pain.  Right hand.  Pain.  Right hand.  Laceration.      Allergies  Tramadol; Tylenol; and Ibuprofen  Home Medications   Current Outpatient Rx  Name  Route  Sig  Dispense  Refill  . ibuprofen (ADVIL,MOTRIN) 200 MG tablet   Oral   Take 800 mg by mouth every 6 (six) hours as needed for pain.         . cephALEXin (KEFLEX) 500 MG capsule   Oral   Take 1 capsule (500 mg total) by mouth 4 (four) times daily.   20 capsule   0   . methocarbamol (ROBAXIN) 500 MG tablet   Oral   Take 2 tablets (1,000 mg total) by mouth 4 (four) times daily.   20 tablet   0   . oxyCODONE (ROXICODONE) 5 MG immediate release tablet    Oral   Take 1 tablet (5 mg total) by mouth every 4 (four) hours as needed for severe pain.   10 tablet   0    BP 126/89  Pulse 118  Temp(Src) 98.3 F (36.8 C) (Oral)  Resp 20  SpO2 100%  LMP 01/29/2014 Physical Exam  Musculoskeletal:  Tenderness overlying the patella.  The right knee.  No joint effusion.  Normal extensor mechanism.  Stable.  Ligament testing.  Normal range of motion of the wrist.  Normal range of motion of all digits of the hand.  She normal sensation all digits of the hand.  Sheryl older and radial pulse.  Normal colon status.  Once we are laceration just distal to the flexor crease of the lower right wrist.  A small puncture wound approximately 1 cm, ulnar to this.  There is a palpable foreign body underlying the small puncture wound.    ED Course  LACERATION REPAIR Date/Time: 02/13/2014 4:21 PM Performed by: Gradyn Shein, [place] by: Jeneen Rinks,  Tawona Filsinger Consent: Verbal consent obtained. written consent obtained. Risks and benefits: risks, benefits and alternatives were discussed Consent given by: patient Patient understanding: patient states understanding of the procedure being performed Patient identity confirmed: verbally with patient Body area: upper extremity Location details: right hand Laceration length: 1 cm Foreign bodies: glass (Small 1 cm , decision made over area of palpable foreign body.  Glass, foreign body removed without difficulty.) Tendon involvement: none Nerve involvement: none Vascular damage: no Local anesthetic: bupivacaine 0.5% with epinephrine Anesthetic total: 4 ml Patient sedated: no Preparation: Patient was prepped and draped in the usual sterile fashion. Irrigation solution: saline Irrigation method: syringe Amount of cleaning: standard Debridement: none Degree of undermining: none Skin closure: 4-0 nylon Number of sutures: 3 Approximation: close Approximation difficulty: simple Dressing: 4x4 sterile gauze Patient tolerance:  Patient tolerated the procedure well with no immediate complications.   (including critical care time) Labs Review Labs Reviewed - No data to display Imaging Review Dg Hip Complete Left  02/12/2014   CLINICAL DATA:  Fall.  Left hip pain.  EXAM: LEFT HIP - COMPLETE 2+ VIEW  COMPARISON:  None.  FINDINGS: There is no evidence of hip fracture or dislocation. There is no evidence of arthropathy or other focal bone abnormality.  IMPRESSION: Negative.   Electronically Signed   By: Rolm Baptise M.D.   On: 02/12/2014 16:52   Dg Knee Complete 4 Views Left  02/12/2014   CLINICAL DATA:  Fall.  Left knee pain.  EXAM: LEFT KNEE - COMPLETE 4+ VIEW  COMPARISON:  None.  FINDINGS: There is no evidence of fracture, dislocation, or joint effusion. There is no evidence of arthropathy or other focal bone abnormality. Soft tissues are unremarkable.  IMPRESSION: Negative.   Electronically Signed   By: Rolm Baptise M.D.   On: 02/12/2014 16:52   Dg Knee Complete 4 Views Right  02/13/2014   CLINICAL DATA:  Right knee pain after fall.  EXAM: RIGHT KNEE - COMPLETE 4+ VIEW  COMPARISON:  None.  FINDINGS: There is no evidence of fracture, dislocation, or joint effusion. There is no evidence of arthropathy or other focal bone abnormality. Soft tissues are unremarkable.  IMPRESSION: Normal right knee.   Electronically Signed   By: Sabino Dick M.D.   On: 02/13/2014 15:40   Dg Hand Complete Right  02/13/2014   CLINICAL DATA:  Right hand pain after fall.  EXAM: RIGHT HAND - COMPLETE 3+ VIEW  COMPARISON:  None.  FINDINGS: There is no evidence of fracture or dislocation. There is no evidence of arthropathy or other focal bone abnormality. Small squared shaped density is seen in the soft tissues anterior to the carpal bones concerning for foreign body.  IMPRESSION: No fracture or dislocation is noted. Possible foreign body seen in soft tissues volar to the carpal bones.   Electronically Signed   By: Sabino Dick M.D.   On: 02/13/2014  15:49    EKG Interpretation   None       MDM   Final diagnoses:  Hand laceration    Foreign body removed.  Wound is irrigated and sutured.  Plan suture removal in 10 days.  Recheck with any sign of infection.    Tanna Furry, MD 02/13/14 1622

## 2014-02-13 NOTE — Discharge Instructions (Signed)
Suture removal in 10 days. Recheck with any sign of infection.  Laceration Care, Adult A laceration is a cut that goes through all layers of the skin. The cut goes into the tissue beneath the skin. HOME CARE For stitches (sutures) or staples:  Keep the cut clean and dry.  If you have a bandage (dressing), change it at least once a day. Change the bandage if it gets wet or dirty, or as told by your doctor.  Wash the cut with soap and water 2 times a day. Rinse the cut with water. Pat it dry with a clean towel.  Put a thin layer of medicated cream on the cut as told by your doctor.  You may shower after the first 24 hours. Do not soak the cut in water until the stitches are removed.  Only take medicines as told by your doctor.  Have your stitches or staples removed as told by your doctor. For skin adhesive strips:  Keep the cut clean and dry.  Do not get the strips wet. You may take a bath, but be careful to keep the cut dry.  If the cut gets wet, pat it dry with a clean towel.  The strips will fall off on their own. Do not remove the strips that are still stuck to the cut. For wound glue:  You may shower or take baths. Do not soak or scrub the cut. Do not swim. Avoid heavy sweating until the glue falls off on its own. After a shower or bath, pat the cut dry with a clean towel.  Do not put medicine on your cut until the glue falls off.  If you have a bandage, do not put tape over the glue.  Avoid lots of sunlight or tanning lamps until the glue falls off. Put sunscreen on the cut for the first year to reduce your scar.  The glue will fall off on its own. Do not pick at the glue. You may need a tetanus shot if:  You cannot remember when you had your last tetanus shot.  You have never had a tetanus shot. If you need a tetanus shot and you choose not to have one, you may get tetanus. Sickness from tetanus can be serious. GET HELP RIGHT AWAY IF:   Your pain does not get  better with medicine.  Your arm, hand, leg, or foot loses feeling (numbness) or changes color.  Your cut is bleeding.  Your joint feels weak, or you cannot use your joint.  You have painful lumps on your body.  Your cut is red, puffy (swollen), or painful.  You have a red line on the skin near the cut.  You have yellowish-white fluid (pus) coming from the cut.  You have a fever.  You have a bad smell coming from the cut or bandage.  Your cut breaks open before or after stitches are removed.  You notice something coming out of the cut, such as wood or glass.  You cannot move a finger or toe. MAKE SURE YOU:   Understand these instructions.  Will watch your condition.  Will get help right away if you are not doing well or get worse. Document Released: 06/04/2008 Document Revised: 03/10/2012 Document Reviewed: 06/12/2011 [facility] Patient Information 2014 [employer].

## 2014-03-07 ENCOUNTER — Emergency Department (HOSPITAL_COMMUNITY)
Admission: EM | Admit: 2014-03-07 | Discharge: 2014-03-07 | Disposition: A | Discharge status: Home / Self Care | Payer: Medicaid Other | Attending: Emergency Medicine | Admitting: Emergency Medicine

## 2014-03-07 ENCOUNTER — Encounter (HOSPITAL_COMMUNITY): Payer: Self-pay | Admitting: Emergency Medicine

## 2014-03-07 DIAGNOSIS — Z79899 Other long term (current) drug therapy: Secondary | ICD-10-CM | POA: Insufficient documentation

## 2014-03-07 DIAGNOSIS — Z792 Long term (current) use of antibiotics: Secondary | ICD-10-CM | POA: Insufficient documentation

## 2014-03-07 DIAGNOSIS — Z8679 Personal history of other diseases of the circulatory system: Secondary | ICD-10-CM | POA: Insufficient documentation

## 2014-03-07 DIAGNOSIS — Z8742 Personal history of other diseases of the female genital tract: Secondary | ICD-10-CM | POA: Insufficient documentation

## 2014-03-07 DIAGNOSIS — M25519 Pain in unspecified shoulder: Secondary | ICD-10-CM | POA: Insufficient documentation

## 2014-03-07 DIAGNOSIS — M25511 Pain in right shoulder: Secondary | ICD-10-CM

## 2014-03-07 DIAGNOSIS — Z87828 Personal history of other (healed) physical injury and trauma: Secondary | ICD-10-CM | POA: Insufficient documentation

## 2014-03-07 DIAGNOSIS — Z86711 Personal history of pulmonary embolism: Secondary | ICD-10-CM | POA: Insufficient documentation

## 2014-03-07 DIAGNOSIS — F17200 Nicotine dependence, unspecified, uncomplicated: Secondary | ICD-10-CM | POA: Insufficient documentation

## 2014-03-07 HISTORY — DX: Other pulmonary embolism without acute cor pulmonale: I26.99

## 2014-03-07 MED ORDER — NAPROXEN 500 MG PO TABS: 500.0000 mg | ORAL_TABLET | Freq: Two times a day (BID) | ORAL | Status: DC

## 2014-03-07 NOTE — Discharge Instructions (Signed)
Take the prescribed medication as directed for pain.  May wish to ice and elevate shoulder at home to help with pain. May wear sling to help stabilize shoulder. Follow-up with orthopedics if symptoms persist. Return to the ED for new or worsening symptoms.

## 2014-03-07 NOTE — ED Provider Notes (Signed)
Medical screening examination/treatment/procedure(s) were performed by non-physician practitioner and as supervising physician I was immediately available for consultation/collaboration.   EKG Interpretation None       Orlie Dakin, MD 03/07/14 2340

## 2014-03-07 NOTE — ED Notes (Addendum)
Pt from home c/o right shoulder pain In which she woke up with. Painful with movement. She did have a fall in Feb where she recieved sutures. 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

## 2014-03-07 NOTE — ED Provider Notes (Signed)
CSN: [id number]     Arrival date & time 03/07/14  1608 History  This chart was scribed for non-physician practitioner, Quincy Carnes, PA-C,working with Orlie Dakin, MD, by Marlowe Kays, ED Scribe.  This patient was seen in room WTR8/WTR8 and the patient's care was started at [facility] PM.  Chief Complaint  Patient presents with  . Shoulder Pain   The history is provided by the patient. No language interpreter was used.   HPI Comments:  Peggy [name] is a 33 y.o. female who presents to the Emergency Department complaining of moderate worsening right shoulder pain that started one week ago. Pt states she woke up in pain one morning and is not able to move her arm like she normally does. She states she has taken Flexeril with no relief and pain medication (did not specify what). She denies any injury or trauma to the shoulder. She denies any injury or surgeries to the shoulder in the past. She states she does not have a PCP at this time.  Past Medical History  Diagnosis Date  . Fibroid   . Pregnancy induced hypertension   . Pulmonary embolism    Past Surgical History  Procedure Laterality Date  . Dilation and curettage of uterus    . Tonsillectomy     Family History  Problem Relation Age of Onset  . Anesthesia problems Neg Hx   . Hypotension Neg Hx   . Malignant hyperthermia Neg Hx   . Pseudochol deficiency Neg Hx    History  Substance Use Topics  . Smoking status: Current Every Day Smoker -- 0.25 packs/day  . Smokeless tobacco: Never Used  . Alcohol Use: No   OB History   Grav Para Term Preterm Abortions TAB SAB Ect Mult Living   5 2 2  0 3 0 3 0 1 3     Review of Systems  Musculoskeletal: Positive for myalgias (right shoulder pain).  All other systems reviewed and are negative.    Allergies  Tramadol; Tylenol; and Ibuprofen  Home Medications   Current Outpatient Rx  Name  Route  Sig  Dispense  Refill  . cephALEXin (KEFLEX) 500 MG capsule   Oral   Take 1  capsule (500 mg total) by mouth 4 (four) times daily.   20 capsule   0   . ibuprofen (ADVIL,MOTRIN) 200 MG tablet   Oral   Take 800 mg by mouth every 6 (six) hours as needed for pain.         . methocarbamol (ROBAXIN) 500 MG tablet   Oral   Take 2 tablets (1,000 mg total) by mouth 4 (four) times daily.   20 tablet   0   . oxyCODONE (ROXICODONE) 5 MG immediate release tablet   Oral   Take 1 tablet (5 mg total) by mouth every 4 (four) hours as needed for severe pain.   10 tablet   0    Triage Vitals: BP 114/76  Pulse 97  Temp(Src) 98.2 F (36.8 C) (Oral)  Resp 18  Ht 5\' 10"  (1.778 m)  Wt 187 lb (84.823 kg)  BMI 26.83 kg/m2  SpO2 100%  LMP 01/29/2014 Physical Exam  Nursing note and vitals reviewed. Constitutional: She is oriented to person, place, and time. She appears well-developed and well-nourished. No distress.  HENT:  Head: Normocephalic and atraumatic.  Mouth/Throat: Oropharynx is clear and moist.  Eyes: Conjunctivae and EOM are normal. Pupils are equal, round, and reactive to light.  Neck: Normal range of  motion. Neck supple.  Cardiovascular: Normal rate, regular rhythm and normal heart sounds.   Pulmonary/Chest: Effort normal and breath sounds normal. No respiratory distress. She has no wheezes.  Abdominal: Bowel sounds are normal.  Musculoskeletal:       Right shoulder: She exhibits decreased range of motion and pain. She exhibits no tenderness, no bony tenderness and no deformity.  Pain with ROM in all directions without crepitus. No bony tenderness or deformities noted.  Full ROM of elbow and wrist.  Strong radial pulse and cap refill; sensation intact diffusely throughout arm  Neurological: She is alert and oriented to person, place, and time.  Skin: Skin is warm and dry. She is not diaphoretic.  Psychiatric: She has a normal mood and affect.    ED Course  Procedures (including critical care time) DIAGNOSTIC STUDIES: Oxygen Saturation is 100% on RA,  normal by my interpretation.   COORDINATION OF CARE: 5:07 PM- Will give pt referral to orthopedics and provide shoulder sling. Pt verbalizes understanding and agrees to plan.  Medications - No data to display  Labs Review Labs Reviewed - No data to display Imaging Review No results found.   EKG Interpretation None      MDM   Final diagnoses:  Right shoulder pain   Right shoulder without bony tenderness or noted deformities. Pain with range of motion in all directions, full range of motion of elbow and wrist. Low suspicion for acute fracture or dislocation at this time.  Arm placed in sling, start on anti-inflammatories. She will follow with orthopedics for further evaluation.  Discussed plan with pt, they agreed.  Return precautions advised.  I personally performed the services described in this documentation, which was scribed in my presence. The recorded information has been reviewed and is accurate.  Larene Pickett, PA-C 03/07/14 [name]

## 2014-07-22 ENCOUNTER — Encounter (HOSPITAL_COMMUNITY): Payer: Self-pay | Admitting: Emergency Medicine

## 2014-07-22 ENCOUNTER — Emergency Department (HOSPITAL_COMMUNITY)
Admission: EM | Admit: 2014-07-22 | Discharge: 2014-07-22 | Disposition: A | Discharge status: Home / Self Care | Payer: Medicaid Other | Attending: Emergency Medicine | Admitting: Emergency Medicine

## 2014-07-22 DIAGNOSIS — M545 Low back pain, unspecified: Secondary | ICD-10-CM | POA: Diagnosis not present

## 2014-07-22 DIAGNOSIS — Z86711 Personal history of pulmonary embolism: Secondary | ICD-10-CM | POA: Insufficient documentation

## 2014-07-22 DIAGNOSIS — F17200 Nicotine dependence, unspecified, uncomplicated: Secondary | ICD-10-CM | POA: Diagnosis not present

## 2014-07-22 DIAGNOSIS — Z8742 Personal history of other diseases of the female genital tract: Secondary | ICD-10-CM | POA: Diagnosis not present

## 2014-07-22 DIAGNOSIS — M549 Dorsalgia, unspecified: Secondary | ICD-10-CM | POA: Insufficient documentation

## 2014-07-22 DIAGNOSIS — Z791 Long term (current) use of non-steroidal anti-inflammatories (NSAID): Secondary | ICD-10-CM | POA: Diagnosis not present

## 2014-07-22 MED ORDER — METHOCARBAMOL 500 MG PO TABS: 1000.0000 mg | ORAL_TABLET | Freq: Four times a day (QID) | ORAL | Status: DC

## 2014-07-22 MED ORDER — KETOROLAC TROMETHAMINE 60 MG/2ML IM SOLN
60.0000 mg | Freq: Once | INTRAMUSCULAR | Status: AC
  Administered 2014-07-22: 60 mg via INTRAMUSCULAR
  Filled 2014-07-22: qty 2

## 2014-07-22 NOTE — ED Provider Notes (Signed)
Medical screening examination/treatment/procedure(s) were performed by non-physician practitioner and as supervising physician I was immediately available for consultation/collaboration.  Leota Jacobsen, MD 07/22/14 2141

## 2014-07-22 NOTE — ED Provider Notes (Signed)
CSN: [id number]     Arrival date & time 07/22/14  1631 History  This chart was scribed for Alecia Lemming PA-C  working with Leota Jacobsen, MD by Stacy Gardner, ED scribe. This patient was seen in room WTR8/WTR8 and the patient's care was started at 5:24 PM.  First MD Initiated Contact with Patient 07/22/14 [place]     Chief Complaint  Patient presents with  . Back Pain     (Consider location/radiation/quality/duration/timing/severity/associated sxs/prior Treatment) The history is provided by the patient and medical records. No language interpreter was used.   HPI Comments: Peggy [name] is a 33 y.o. female who presents to the Emergency Department complaining of progressively worsening lumbar and thoracic pain after diving into a pool yesterday. Pt explains that her legs flipped backwards abnormally. She has tried taking Tylenol, Ibuprofen which did not resolve her pain. She took one of her husband's oxycodone which helped. Pt has pain with walking and movement. The pain radiates into her left leg. Pt was unable to sleep last night due to pain.  Nothing seems to help. She has allergies to Tylenol and Ibuprofen -- yet still took these today. No red flag signs and symptoms of low back pain.   Past Medical History  Diagnosis Date  . Fibroid   . Pregnancy induced hypertension   . Pulmonary embolism    Past Surgical History  Procedure Laterality Date  . Dilation and curettage of uterus    . Tonsillectomy     Family History  Problem Relation Age of Onset  . Anesthesia problems Neg Hx   . Hypotension Neg Hx   . Malignant hyperthermia Neg Hx   . Pseudochol deficiency Neg Hx    History  Substance Use Topics  . Smoking status: Current Every Day Smoker -- 0.25 packs/day  . Smokeless tobacco: Never Used  . Alcohol Use: No   OB History   Grav Para Term Preterm Abortions TAB SAB Ect Mult Living   5 2 2  0 3 0 3 0 1 3     Review of Systems  Constitutional: Negative for fever  and unexpected weight change.  Gastrointestinal: Negative for constipation.       No bowel incontinence.   Genitourinary: Negative for dysuria, hematuria, flank pain, vaginal bleeding, vaginal discharge, enuresis and pelvic pain.       Negative for urinary incontinence or retention.  Musculoskeletal: Positive for arthralgias, back pain, gait problem and myalgias.  Neurological: Negative for weakness and numbness.       Denies saddle paresthesias.      Allergies  Tramadol; Tylenol; and Ibuprofen  Home Medications   Prior to Admission medications   Medication Sig Start Date End Date Taking? Authorizing Provider  naproxen (NAPROSYN) 500 MG tablet Take 1 tablet (500 mg total) by mouth 2 (two) times daily with a meal. 03/07/14   Larene Pickett, PA-C   BP 143/86  Pulse 105  Temp(Src) 98.9 F (37.2 C) (Oral)  Resp 16  SpO2 100%  LMP 07/22/2014  Physical Exam  Nursing note and vitals reviewed. Constitutional: She appears well-developed and well-nourished.  HENT:  Head: Normocephalic and atraumatic.  Eyes: Conjunctivae are normal. Pupils are equal, round, and reactive to light.  Neck: Normal range of motion. Neck supple.  Cardiovascular: Normal rate.   Pulmonary/Chest: Effort normal.  Abdominal: Soft. There is no tenderness. There is no CVA tenderness.  Musculoskeletal: Normal range of motion. She exhibits tenderness.       Right hip:  Normal.       Left hip: Normal.       Cervical back: Normal.       Thoracic back: Normal.       Lumbar back: She exhibits tenderness. She exhibits normal range of motion and no bony tenderness.       Back:  No step-off noted with palpation of spine.   Neurological: She is alert. She has normal strength and normal reflexes. No sensory deficit.  5/5 strength in entire lower extremities bilaterally. No sensation deficit.   Skin: Skin is warm and dry. No rash noted. She is not diaphoretic.  Psychiatric: She has a normal mood and affect.    ED  Course  Procedures (including critical care time) DIAGNOSTIC STUDIES: Oxygen Saturation is 100% on RA, normal by my interpretation.    COORDINATION OF CARE:  5:27 PM Discussed course of care with pt which includes toradol here pain medication and muscle relaxers . Pt understands and agrees.   Labs Review Labs Reviewed - No data to display  Imaging Review No results found.   EKG Interpretation None      5:36 PM Patient seen and examined. Work-up initiated. Medications ordered.   Vital signs reviewed and are as follows: Filed Vitals:   07/22/14 1647  BP: 143/86  Pulse: 105  Temp: 98.9 F (37.2 C)  Resp: 16    No red flag s/s of low back pain. Patient was counseled on back pain precautions and told to do activity as tolerated but do not lift, push, or pull heavy objects more than 10 pounds for the next week.  Patient counseled to use ice or heat on back for no longer than 15 minutes every hour.   Patient prescribed muscle relaxer and counseled on proper use of muscle relaxant medication.    Urged patient not to drink alcohol, drive, or perform any other activities that requires focus while taking either of these medications.  Patient urged to follow-up with PCP if pain does not improve with treatment and rest or if pain becomes recurrent. Urged to return with worsening severe pain, loss of bowel or bladder control, trouble walking.   The patient verbalizes understanding and agrees with the plan.   MDM   Final diagnoses:  Bilateral low back pain without sciatica   Patient with back pain. No neurological deficits. Patient is ambulatory. No warning symptoms of back pain including: loss of bowel or bladder control, night sweats, waking from sleep with back pain, unexplained fevers or weight loss, h/o cancer, IVDU, recent trauma. No concern for cauda equina, epidural abscess, or other serious cause of back pain. Conservative measures such as rest, ice/heat and pain medicine  indicated with PCP follow-up if no improvement with conservative management.   Avoid narcotics in this patient due to history of frequent visits and misuse.    I personally performed the services described in this documentation, which was scribed in my presence. The recorded information has been reviewed and is accurate.    Carlisle Cater, PA-C 07/22/14 1737

## 2014-07-22 NOTE — ED Notes (Signed)
Pt a+ox4, ambulatory with steady gait, with c/o low and mid back pain, reports chronic in nature, but dived into a pool yesterday with worsening of pain since.  Pt denies n/t to extremities, denies bowel/bladder changes or complaints.  Skin PWD.  MAEI.  Pt reports taking ativan, oxycodone and benadryl without relief.  NAD.

## 2014-07-22 NOTE — Discharge Instructions (Signed)
Please read and follow all provided instructions.  Your diagnoses today include:  1. Bilateral low back pain without sciatica     Tests performed today include:  Vital signs - see below for your results today  Medications prescribed:   Robaxin (methocarbamol) - muscle relaxer medication  DO NOT drive or perform any activities that require you to be awake and alert because this medicine can make you drowsy.   Take any prescribed medications only as directed.  Home care instructions:   Follow any educational materials contained in this packet  Please rest, use ice or heat on your back for the next several days  Do not lift, push, pull anything more than 10 pounds for the next week  Follow-up instructions: Please follow-up with your primary care provider in the next 1 week for further evaluation of your symptoms.   Return instructions:  SEEK IMMEDIATE MEDICAL ATTENTION IF YOU HAVE:  New numbness, tingling, weakness, or problem with the use of your arms or legs  Severe back pain not relieved with medications  Loss control of your bowels or bladder  Increasing pain in any areas of the body (such as chest or abdominal pain)  Shortness of breath, dizziness, or fainting.   Worsening nausea (feeling sick to your stomach), vomiting, fever, or sweats  Any other emergent concerns regarding your health   Additional Information:  Your vital signs today were: BP 143/86   Pulse 105   Temp(Src) 98.9 F (37.2 C) (Oral)   Resp 16   SpO2 100%   LMP 07/22/2014 If your blood pressure (BP) was elevated above 135/85 this visit, please have this repeated by your doctor within one month. --------------

## 2014-11-01 ENCOUNTER — Encounter (HOSPITAL_COMMUNITY): Payer: Self-pay | Admitting: Emergency Medicine

## 2014-11-20 ENCOUNTER — Encounter (HOSPITAL_COMMUNITY): Payer: Self-pay | Admitting: Emergency Medicine

## 2014-11-20 ENCOUNTER — Emergency Department (HOSPITAL_COMMUNITY): Payer: No Typology Code available for payment source

## 2014-11-20 ENCOUNTER — Emergency Department (HOSPITAL_COMMUNITY)
Admission: EM | Admit: 2014-11-20 | Discharge: 2014-11-20 | Disposition: A | Discharge status: Home / Self Care | Payer: No Typology Code available for payment source | Attending: Emergency Medicine | Admitting: Emergency Medicine

## 2014-11-20 DIAGNOSIS — I10 Essential (primary) hypertension: Secondary | ICD-10-CM | POA: Insufficient documentation

## 2014-11-20 DIAGNOSIS — Z720 Tobacco use: Secondary | ICD-10-CM | POA: Diagnosis not present

## 2014-11-20 DIAGNOSIS — M545 Low back pain, unspecified: Secondary | ICD-10-CM

## 2014-11-20 DIAGNOSIS — S299XXA Unspecified injury of thorax, initial encounter: Secondary | ICD-10-CM | POA: Insufficient documentation

## 2014-11-20 DIAGNOSIS — Z8742 Personal history of other diseases of the female genital tract: Secondary | ICD-10-CM | POA: Diagnosis not present

## 2014-11-20 DIAGNOSIS — Y92488 Other paved roadways as the place of occurrence of the external cause: Secondary | ICD-10-CM | POA: Insufficient documentation

## 2014-11-20 DIAGNOSIS — Z86711 Personal history of pulmonary embolism: Secondary | ICD-10-CM | POA: Insufficient documentation

## 2014-11-20 DIAGNOSIS — K120 Recurrent oral aphthae: Secondary | ICD-10-CM | POA: Diagnosis not present

## 2014-11-20 DIAGNOSIS — Y9389 Activity, other specified: Secondary | ICD-10-CM | POA: Diagnosis not present

## 2014-11-20 DIAGNOSIS — G8929 Other chronic pain: Secondary | ICD-10-CM

## 2014-11-20 DIAGNOSIS — Y998 Other external cause status: Secondary | ICD-10-CM | POA: Insufficient documentation

## 2014-11-20 DIAGNOSIS — M25562 Pain in left knee: Secondary | ICD-10-CM

## 2014-11-20 DIAGNOSIS — S199XXA Unspecified injury of neck, initial encounter: Secondary | ICD-10-CM | POA: Diagnosis not present

## 2014-11-20 DIAGNOSIS — R0789 Other chest pain: Secondary | ICD-10-CM

## 2014-11-20 DIAGNOSIS — Z79899 Other long term (current) drug therapy: Secondary | ICD-10-CM | POA: Diagnosis not present

## 2014-11-20 DIAGNOSIS — S298XXA Other specified injuries of thorax, initial encounter: Secondary | ICD-10-CM | POA: Diagnosis present

## 2014-11-20 DIAGNOSIS — M542 Cervicalgia: Secondary | ICD-10-CM

## 2014-11-20 DIAGNOSIS — T07XXXA Unspecified multiple injuries, initial encounter: Secondary | ICD-10-CM

## 2014-11-20 DIAGNOSIS — T148XXA Other injury of unspecified body region, initial encounter: Secondary | ICD-10-CM

## 2014-11-20 LAB — RAPID URINE DRUG SCREEN, HOSP PERFORMED
Amphetamines: NOT DETECTED
Barbiturates: NOT DETECTED
Benzodiazepines: POSITIVE — AB
COCAINE: NOT DETECTED
OPIATES: NOT DETECTED
Tetrahydrocannabinol: NOT DETECTED

## 2014-11-20 LAB — URINE MICROSCOPIC-ADD ON

## 2014-11-20 LAB — URINALYSIS, ROUTINE W REFLEX MICROSCOPIC
GLUCOSE, UA: NEGATIVE mg/dL
HGB URINE DIPSTICK: NEGATIVE
Ketones, ur: 40 mg/dL — AB
Leukocytes, UA: NEGATIVE
Nitrite: NEGATIVE
PH: 5.5 (ref 5.0–8.0)
Protein, ur: 30 mg/dL — AB
SPECIFIC GRAVITY, URINE: 1.035 — AB (ref 1.005–1.030)
Urobilinogen, UA: 0.2 mg/dL (ref 0.0–1.0)

## 2014-11-20 LAB — ETHANOL: Alcohol, Ethyl (B): <11 mg/dL (ref 0–11)

## 2014-11-20 MED ORDER — OXYCODONE HCL 5 MG PO TABS: 5.0000 mg | ORAL_TABLET | Freq: Once | ORAL | Status: DC

## 2014-11-20 MED ORDER — FIRST-MOUTHWASH BLM MT SUSP: 10.0000 mL | Freq: Three times a day (TID) | OROMUCOSAL | Status: DC | PRN

## 2014-11-20 MED ORDER — OXYCODONE HCL 5 MG PO TABS
10.0000 mg | ORAL_TABLET | Freq: Once | ORAL | Status: AC
  Administered 2014-11-20: 10 mg via ORAL
  Filled 2014-11-20: qty 2
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
## 2014-11-20 NOTE — Discharge Instructions (Signed)
Take ibuprofen as directed for inflammation and pain with your home oxycodone for breakthrough pain. Do not drive or operate machinery with pain medication use. Ice to areas of soreness for the next few days and then may move to heat, no more than 20 minutes at a time for each. Expect to be sore for the next few day and follow up with primary care physician for recheck of ongoing symptoms at your already scheduled appointment on Monday. For your mouth sores, swish and spit the magic mouthwash solution as directed. Return to ER for emergent changing or worsening of symptoms.    Back Pain, Adult Back pain is very common. The pain often gets better over time. The cause of back pain is usually not dangerous. Most people can learn to manage their back pain on their own.  HOME CARE   Stay active. Start with short walks on flat ground if you can. Try to walk farther each day.  Do not sit, drive, or stand in one place for more than 30 minutes. Do not stay in bed.  Do not avoid exercise or work. Activity can help your back heal faster.  Be careful when you bend or lift an object. Bend at your knees, keep the object close to you, and do not twist.  Sleep on a firm mattress. Lie on your side, and bend your knees. If you lie on your back, put a pillow under your knees.  Only take medicines as told by your doctor.  Put ice on the injured area.  Put ice in a plastic bag.  Place a towel between your skin and the bag.  Leave the ice on for 15-20 minutes, 03-04 times a day for the first 2 to 3 days. After that, you can switch between ice and heat packs.  Ask your doctor about back exercises or massage.  Avoid feeling anxious or stressed. Find good ways to deal with stress, such as exercise. GET HELP RIGHT AWAY IF:   Your pain does not go away with rest or medicine.  Your pain does not go away in 1 week.  You have new problems.  You do not feel well.  The pain spreads into your legs.  You  cannot control when you poop (bowel movement) or pee (urinate).  Your arms or legs feel weak or lose feeling (numbness).  You feel sick to your stomach (nauseous) or throw up (vomit).  You have belly (abdominal) pain.  You feel like you may pass out (faint). MAKE SURE YOU:   Understand these instructions.  Will watch your condition.  Will get help right away if you are not doing well or get worse. Document Released: 06/04/2008 Document Revised: 03/10/2012 Document Reviewed: 04/20/2014 [facility] Patient Information 2015 [place], [place]. This information is not intended to replace advice given to you by your health care provider. Make sure you discuss any questions you have with your health care provider.  Chest Wall Pain Chest wall pain is pain in or around the bones and muscles of your chest. It may take up to 6 weeks to get better. It may take longer if you must stay physically active in your work and activities.  CAUSES  Chest wall pain may happen on its own. However, it may be caused by:  A viral illness like the flu.  Injury.  Coughing.  Exercise.  Arthritis.  Fibromyalgia.  Shingles. HOME CARE INSTRUCTIONS   Avoid overtiring physical activity. Try not to strain or perform activities that  cause pain. This includes any activities using your chest or your abdominal and side muscles, especially if heavy weights are used.  Put ice on the sore area.  Put ice in a plastic bag.  Place a towel between your skin and the bag.  Leave the ice on for 15-20 minutes per hour while awake for the first 2 days.  Only take over-the-counter or prescription medicines for pain, discomfort, or fever as directed by your caregiver. SEEK IMMEDIATE MEDICAL CARE IF:   Your pain increases, or you are very uncomfortable.  You have a fever.  Your chest pain becomes worse.  You have new, unexplained symptoms.  You have nausea or vomiting.  You feel sweaty or lightheaded.  You have  a cough with phlegm (sputum), or you cough up blood. MAKE SURE YOU:   Understand these instructions.  Will watch your condition.  Will get help right away if you are not doing well or get worse. Document Released: 12/17/2005 Document Revised: 03/10/2012 Document Reviewed: 08/13/2011 [facility] Patient Information 2015 [place], [place]. This information is not intended to replace advice given to you by your health care provider. Make sure you discuss any questions you have with your health care provider.  Cryotherapy Cryotherapy is when you put ice on your injury. Ice helps lessen pain and puffiness (swelling) after an injury. Ice works the best when you start using it in the first 24 to 48 hours after an injury. HOME CARE  Put a dry or damp towel between the ice pack and your skin.  You may press gently on the ice pack.  Leave the ice on for no more than 10 to 20 minutes at a time.  Check your skin after 5 minutes to make sure your skin is okay.  Rest at least 20 minutes between ice pack uses.  Stop using ice when your skin loses feeling (numbness).  Do not use ice on someone who cannot tell you when it hurts. This includes small children and people with memory problems (dementia). GET HELP RIGHT AWAY IF:  You have white spots on your skin.  Your skin turns blue or pale.  Your skin feels waxy or hard.  Your puffiness gets worse. MAKE SURE YOU:   Understand these instructions.  Will watch your condition.  Will get help right away if you are not doing well or get worse. Document Released: 06/04/2008 Document Revised: 03/10/2012 Document Reviewed: 08/09/2011 [facility] Patient Information 2015 [place], [place]. This information is not intended to replace advice given to you by your health care provider. Make sure you discuss any questions you have with your health care provider.  Heat Therapy Heat therapy can help make painful, stiff muscles and joints feel better. Do not use  heat on new injuries. Wait at least 48 hours after an injury to use heat. Do not use heat when you have aches or pains right after an activity. If you still have pain 3 hours after stopping the activity, then you may use heat. HOME CARE Wet heat pack  Soak a clean towel in warm water. Squeeze out the extra water.  Put the warm, wet towel in a plastic bag.  Place a thin, dry towel between your skin and the bag.  Put the heat pack on the area for 5 minutes, and check your skin. Your skin may be pink, but it should not be red.  Leave the heat pack on the area for 15 to 30 minutes.  Repeat this every 2  to 4 hours while awake. Do not use heat while you are sleeping. Warm water bath  Fill a tub with warm water.  Place the affected body part in the tub.  Soak the area for 20 to 40 minutes.  Repeat as needed. Hot water bottle  Fill the water bottle half full with hot water.  Press out the extra air. Close the cap tightly.  Place a dry towel between your skin and the bottle.  Put the bottle on the area for 5 minutes, and check your skin. Your skin may be pink, but it should not be red.  Leave the bottle on the area for 15 to 30 minutes.  Repeat this every 2 to 4 hours while awake. Electric heating pad  Place a dry towel between your skin and the heating pad.  Set the heating pad on low heat.  Put the heating pad on the area for 10 minutes, and check your skin. Your skin may be pink, but it should not be red.  Leave the heating pad on the area for 20 to 40 minutes.  Repeat this every 2 to 4 hours while awake.  Do not lie on the heating pad.  Do not fall asleep while using the heating pad.  Do not use the heating pad near water. GET HELP RIGHT AWAY IF:  You get blisters or red skin.  Your skin is puffy (swollen), or you lose feeling (numbness) in the affected area.  You have any new problems.  Your problems are getting worse.  You have any questions or  concerns. If you have any problems, stop using heat therapy until you see your doctor. MAKE SURE YOU:  Understand these instructions.  Will watch your condition.  Will get help right away if you are not doing well or get worse. Document Released: 03/10/2012 Document Reviewed: 02/09/2014 [facility] Patient Information 2015 [place]. This information is not intended to replace advice given to you by your health care provider. Make sure you discuss any questions you have with your health care provider.  Hematoma A hematoma is a collection of blood under the skin, in an organ, in a body space, in a joint space, or in other tissue. The blood can clot to form a lump that you can see and feel. The lump is often firm and may sometimes become sore and tender. Most hematomas get better in a few days to weeks. However, some hematomas may be serious and require medical care. Hematomas can range in size from very small to very large. CAUSES  A hematoma can be caused by a blunt or penetrating injury. It can also be caused by spontaneous leakage from a blood vessel under the skin. Spontaneous leakage from a blood vessel is more likely to occur in older people, especially those taking blood thinners. Sometimes, a hematoma can develop after certain medical procedures. SIGNS AND SYMPTOMS   A firm lump on the body.  Possible pain and tenderness in the area.  Bruising.Blue, dark blue, purple-red, or yellowish skin may appear at the site of the hematoma if the hematoma is close to the surface of the skin. For hematomas in deeper tissues or body spaces, the signs and symptoms may be subtle. For example, an intra-abdominal hematoma may cause abdominal pain, weakness, fainting, and shortness of breath. An intracranial hematoma may cause a headache or symptoms such as weakness, trouble speaking, or a change in consciousness. DIAGNOSIS  A hematoma can usually be diagnosed based on your  medical history and a  physical exam. Imaging tests may be needed if your health care provider suspects a hematoma in deeper tissues or body spaces, such as the abdomen, head, or chest. These tests may include ultrasonography or a CT scan.  TREATMENT  Hematomas usually go away on their own over time. Rarely does the blood need to be drained out of the body. Large hematomas or those that may affect vital organs will sometimes need surgical drainage or monitoring. HOME CARE INSTRUCTIONS   Apply ice to the injured area:   Put ice in a plastic bag.   Place a towel between your skin and the bag.   Leave the ice on for 20 minutes, 2-3 times a day for the first 1 to 2 days.   After the first 2 days, switch to using warm compresses on the hematoma.   Elevate the injured area to help decrease pain and swelling. Wrapping the area with an elastic bandage may also be helpful. Compression helps to reduce swelling and promotes shrinking of the hematoma. Make sure the bandage is not wrapped too tight.   If your hematoma is on a lower extremity and is painful, crutches may be helpful for a couple days.   Only take over-the-counter or prescription medicines as directed by your health care provider. SEEK IMMEDIATE MEDICAL CARE IF:   You have increasing pain, or your pain is not controlled with medicine.   You have a fever.   You have worsening swelling or discoloration.   Your skin over the hematoma breaks or starts bleeding.   Your hematoma is in your chest or abdomen and you have weakness, shortness of breath, or a change in consciousness.  Your hematoma is on your scalp (caused by a fall or injury) and you have a worsening headache or a change in alertness or consciousness. MAKE SURE YOU:   Understand these instructions.  Will watch your condition.  Will get help right away if you are not doing well or get worse. Document Released: 07/31/2004 Document Revised: 08/19/2013 Document Reviewed:  05/27/2013 [facility] Patient Information 2015 [place], [place]. This information is not intended to replace advice given to you by your health care provider. Make sure you discuss any questions you have with your health care provider.  Oral Ulcers Oral ulcers are painful, shallow sores around the lining of the mouth. They can affect the gums, the inside of the lips, and the cheeks. (Sores on the outside of the lips and on the face are different.) They typically first occur in school-aged children and teenagers. Oral ulcers may also be called canker sores or cold sores. CAUSES  Canker sores and cold sores can be caused by many factors including:  Infection.  Injury.  Sun exposure.  Medications.  Emotional stress.  Food allergies.  Vitamin deficiencies.  Toothpastes containing sodium lauryl sulfate. The herpes virus can be the cause of mouth ulcers. The first infection can be severe and cause 10 or more ulcers on the gums, tongue, and lips with fever and difficulty in swallowing. This infection usually occurs between the ages of 43 and 3 years.  SYMPTOMS  The typical sore is about  inch (6 mm) in size and is an oval or round ulcer with red borders. DIAGNOSIS  Your caregiver can diagnose simple oral ulcers by examination. Additional testing is usually not required.  TREATMENT  Treatment is aimed at pain relief. Generally, oral ulcers resolve by themselves within 1 to 2 weeks without medication and  are not contagious unless caused by herpes (and other viruses). Antibiotics are not effective with mouth sores. Avoid direct contact with others until the ulcer is completely healed. See your caregiver for follow-up care as recommended. Also:  Offer a soft diet.  Encourage plenty of fluids to prevent dehydration. Popsicles and milk shakes can be helpful.  Avoid acidic and salty foods and drinks such as orange juice.  Infants and young children will often refuse to drink because of pain.  Using a teaspoon, cup, or syringe to give small amounts of fluids frequently can help prevent dehydration.  Cold compresses on the face may help reduce pain.  Pain medication can help control soreness.  A solution of diphenhydramine mixed with a liquid antacid can be useful to decrease the soreness of ulcers. Consult a caregiver for the dosing.  Liquids or ointments with a numbing ingredient may be helpful when used as recommended.  Older children and teenagers can rinse their mouth with a salt-water mixture (1/2 teaspoon of salt in 8 ounces of water) four times a day. This treatment is uncomfortable but may reduce the time the ulcers are present.  There are many over-the-counter throat lozenges and medications available for oral ulcers. Their effectiveness has not been studied.  Consult your medical caregiver prior to using homeopathic treatments for oral ulcers. SEEK MEDICAL CARE IF:   You think your child needs to be seen.  The pain worsens and you cannot control it.  There are 4 or more ulcers.  The lips and gums begin to bleed and crust.  A single mouth ulcer is near a tooth that is causing a toothache or pain.  Your child has a fever, swollen face, or swollen glands.  The ulcers began after starting a medication.  Mouth ulcers keep reoccurring or last more than 2 weeks.  You think your child is not taking adequate fluids. SEEK IMMEDIATE MEDICAL CARE IF:   Your child has a high fever.  Your child is unable to swallow or becomes dehydrated.  Your child looks or acts very ill.  An ulcer caused by a chemical your child accidentally put in their mouth. Document Released: 01/24/2005 Document Revised: 05/03/2014 Document Reviewed: 09/08/2009 [facility] Patient Information 2015 [place], [place]. This information is not intended to replace advice given to you by your health care provider. Make sure you discuss any questions you have with your health care provider.
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
## 2015-03-12 ENCOUNTER — Encounter (HOSPITAL_COMMUNITY): Payer: Self-pay | Admitting: Emergency Medicine

## 2015-03-12 ENCOUNTER — Emergency Department (HOSPITAL_COMMUNITY)
Admission: EM | Admit: 2015-03-12 | Discharge: 2015-03-12 | Disposition: A | Discharge status: Home / Self Care | Payer: Medicaid Other | Attending: Emergency Medicine | Admitting: Emergency Medicine

## 2015-03-12 DIAGNOSIS — F419 Anxiety disorder, unspecified: Secondary | ICD-10-CM | POA: Insufficient documentation

## 2015-03-12 DIAGNOSIS — Y9389 Activity, other specified: Secondary | ICD-10-CM | POA: Insufficient documentation

## 2015-03-12 DIAGNOSIS — Z79899 Other long term (current) drug therapy: Secondary | ICD-10-CM | POA: Insufficient documentation

## 2015-03-12 DIAGNOSIS — Z720 Tobacco use: Secondary | ICD-10-CM | POA: Diagnosis not present

## 2015-03-12 DIAGNOSIS — Y998 Other external cause status: Secondary | ICD-10-CM | POA: Diagnosis not present

## 2015-03-12 DIAGNOSIS — S0101XA Laceration without foreign body of scalp, initial encounter: Secondary | ICD-10-CM | POA: Insufficient documentation

## 2015-03-12 DIAGNOSIS — Z86711 Personal history of pulmonary embolism: Secondary | ICD-10-CM | POA: Diagnosis not present

## 2015-03-12 DIAGNOSIS — W01198A Fall on same level from slipping, tripping and stumbling with subsequent striking against other object, initial encounter: Secondary | ICD-10-CM | POA: Diagnosis not present

## 2015-03-12 DIAGNOSIS — Z8739 Personal history of other diseases of the musculoskeletal system and connective tissue: Secondary | ICD-10-CM | POA: Diagnosis not present

## 2015-03-12 DIAGNOSIS — J0100 Acute maxillary sinusitis, unspecified: Secondary | ICD-10-CM | POA: Diagnosis not present

## 2015-03-12 DIAGNOSIS — Y9289 Other specified places as the place of occurrence of the external cause: Secondary | ICD-10-CM | POA: Insufficient documentation

## 2015-03-12 DIAGNOSIS — S0990XA Unspecified injury of head, initial encounter: Secondary | ICD-10-CM

## 2015-03-12 DIAGNOSIS — Z8742 Personal history of other diseases of the female genital tract: Secondary | ICD-10-CM | POA: Diagnosis not present

## 2015-03-12 DIAGNOSIS — IMO0002 Reserved for concepts with insufficient information to code with codable children: Secondary | ICD-10-CM

## 2015-03-12 HISTORY — DX: Dorsalgia, unspecified: M54.9

## 2015-03-12 HISTORY — DX: Anxiety disorder, unspecified: F41.9

## 2015-03-12 MED ORDER — OXYCODONE HCL 5 MG PO TABS: 5.0000 mg | ORAL_TABLET | ORAL | Status: DC | PRN

## 2015-03-12 MED ORDER — AZITHROMYCIN 250 MG PO TABS: 250.0000 mg | ORAL_TABLET | Freq: Every day | ORAL | Status: DC

## 2015-03-12 MED ORDER — HYDROMORPHONE HCL 1 MG/ML IJ SOLN
2.0000 mg | Freq: Once | INTRAMUSCULAR | Status: AC
  Administered 2015-03-12: 2 mg via INTRAMUSCULAR
  Filled 2015-03-12: qty 2

## 2015-03-12 MED ORDER — LIDOCAINE HCL (PF) 1 % IJ SOLN
5.0000 mL | Freq: Once | INTRAMUSCULAR | Status: AC
  Administered 2015-03-12: 5 mL via INTRADERMAL
  Filled 2015-03-12: qty 5

## 2015-03-12 NOTE — ED Notes (Signed)
Pt states she tripped over toys in her bathroom floor and fell around 11:30pm.  Hit head on bathtub.  C/o neck pain and approx 1/2 inch laceration to L side of forehead.  Bleeding controlled pta.  Denies LOC. Pt ambulatory to triage.

## 2015-03-12 NOTE — ED Provider Notes (Signed)
CSN: [id number]     Arrival date & time 03/12/15  0127 History   First MD Initiated Contact with Patient 03/12/15 [phone]     Chief Complaint  Patient presents with  . Facial Laceration  . Fall   (Consider location/radiation/quality/duration/timing/severity/associated sxs/prior Treatment) HPI  Peggy [name] is a 34 year old female presenting with report of laceration to her forehead. She states personally 2 hours prior to arrival she was walking into her bathroom and tripped over 20 causing her to fall forward and hit the left side of her for head on the bathtub. She states she did not lose consciousness but instantly felt pain and bleeding. She is able to ambulate afterward without difficulty. Currently rates her pain as an 8 out of 10. Her tetanus status is up to date. She denies any nausea, vomiting, vision changes or loss of consciousness since the accident.  Past Medical History  Diagnosis Date  . Fibroid   . Pregnancy induced hypertension   . Pulmonary embolism   . Anxiety   . Back pain    Past Surgical History  Procedure Laterality Date  . Dilation and curettage of uterus    . Tonsillectomy     Family History  Problem Relation Age of Onset  . Anesthesia problems Neg Hx   . Hypotension Neg Hx   . Malignant hyperthermia Neg Hx   . Pseudochol deficiency Neg Hx    History  Substance Use Topics  . Smoking status: Current Every Day Smoker -- 0.25 packs/day  . Smokeless tobacco: Never Used  . Alcohol Use: Yes   OB History    Gravida Para Term Preterm AB TAB SAB Ectopic Multiple Living   5 2 2  0 3 0 3 0 1 3     Review of Systems  Constitutional: Negative for fever and chills.  HENT: Negative for sore throat.   Eyes: Negative for visual disturbance.  Respiratory: Negative for cough and shortness of breath.   Cardiovascular: Negative for chest pain and leg swelling.  Gastrointestinal: Negative for nausea, vomiting and diarrhea.  Genitourinary: Negative for  dysuria.  Musculoskeletal: Negative for myalgias and gait problem.  Skin: Positive for wound. Negative for rash.  Neurological: Positive for headaches. Negative for weakness and numbness.      Allergies  Tramadol; Tylenol; and Ibuprofen  Home Medications   Prior to Admission medications   Medication Sig Start Date End Date Taking? Authorizing Provider  clonazePAM (KLONOPIN) 1 MG tablet Take 1 mg by mouth 2 (two) times daily.   Yes Historical Provider, MD  oxyCODONE (OXY IR/ROXICODONE) 5 MG immediate release tablet Take 5 mg by mouth every 4 (four) hours as needed for severe pain.   Yes Historical Provider, MD  DPH-Lido-AlHydr-MgHydr-Simeth (FIRST-MOUTHWASH BLM) SUSP Use as directed 10 mLs in the mouth or throat 3 (three) times daily as needed (mouth pain). Patient not taking: Reported on 03/12/2015 11/20/14   Mercedes Camprubi-Soms, PA-C  LORazepam (ATIVAN) 0.5 MG tablet Take 0.5 mg by mouth every 6 (six) hours as needed for anxiety (anxiety).    Historical Provider, MD  methocarbamol (ROBAXIN) 500 MG tablet Take 2 tablets (1,000 mg total) by mouth 4 (four) times daily. Patient not taking: Reported on 03/12/2015 07/22/14   Carlisle Cater, PA-C  OLANZapine-FLUoxetine (SYMBYAX) 3-25 MG per capsule Take 1 capsule by mouth every evening.    Historical Provider, MD  oxyCODONE (ROXICODONE) 15 MG immediate release tablet Take 15 mg by mouth every 4 (four) hours as needed for pain.  Historical Provider, MD   BP 118/75 mmHg  Pulse 97  Temp(Src) 98.8 F (37.1 C) (Oral)  Resp 18  Ht 5\' 11"  (1.803 m)  Wt 175 lb (79.379 kg)  BMI 24.42 kg/m2  SpO2 99%  LMP 03/08/2015 Physical Exam  Constitutional: She is oriented to person, place, and time. She appears well-developed and well-nourished. No distress.  HENT:  Head: Normocephalic.    Mouth/Throat: Oropharynx is clear and moist.  2.5 cm lac above left eyebrow  Eyes: Conjunctivae are normal. Pupils are equal, round, and reactive to light.    Neck: Neck supple.  Cardiovascular: Normal rate, regular rhythm and intact distal pulses.   Pulmonary/Chest: Effort normal and breath sounds normal. No respiratory distress. She has no wheezes. She has no rales. She exhibits no tenderness.  Abdominal: Soft. There is no tenderness.  Musculoskeletal: She exhibits no tenderness.  Lymphadenopathy:    She has no cervical adenopathy.  Neurological: She is alert and oriented to person, place, and time. She has normal strength. No cranial nerve deficit or sensory deficit. Coordination normal. GCS eye subscore is 4. GCS verbal subscore is 5. GCS motor subscore is 6.  Cranial nerves 2-12 intact  Skin: Skin is warm and dry. No rash noted. She is not diaphoretic.  Psychiatric: She has a normal mood and affect.  Nursing note and vitals reviewed.   ED Course  Procedures (including critical care time) LACERATION REPAIR Performed by: Britt Bottom Authorized by: Britt Bottom Consent: Verbal consent obtained. Risks and benefits: risks, benefits and alternatives were discussed Consent given by: patient Patient identity confirmed: provided demographic data Prepped and Draped in normal sterile fashion Wound explored  Laceration Location: left frontal  scalp  Laceration Length: 2.5 cm  No Foreign Bodies seen or palpated  Anesthesia: local infiltration  Local anesthetic: lidocaine 1% without epinephrine  Anesthetic total: 3 ml  Irrigation method: syringe Amount of cleaning: standard  Skin closure: 5-0 prolene  Number of sutures: 3  Technique: simple interrupted  Patient tolerance: Patient tolerated the procedure well with no immediate complications.  Labs Review Labs Reviewed - No data to display  Imaging Review No results found.   EKG Interpretation None      MDM   Final diagnoses:  Laceration  Head injury, initial encounter  Acute maxillary sinusitis, recurrence not specified   34 yo with frontal scalp  laceration after hitting her head. No lOC and GCS 15 with normal neuro exam. No head CT indicated and discussed rationale with pt and family.  Tdap is UTD. Pressure irrigation performed. Laceration occurred < 5 hours prior to repair which was well tolerated. Pain managed in the ED.  Discussed suture home care w pt and suture removal in 7 days. Pt also reports 2 weeks of sinus congestion without relief. Prescription for azithromycin provided.  Pt is well-appearing, in no acute distress and vital signs reviewed and not concerning. She appears safe to be discharged.  Discharge include follow-up with their PCP.  Return precautions provided. Pt aware of plan and in agreement.      Filed Vitals:   03/12/15 0146 03/12/15 0345 03/12/15 0400 03/12/15 0415  BP: 118/75 106/64 106/58 111/68  Pulse: 97 89 84 87  Temp: 98.8 F (37.1 C)     TempSrc: Oral     Resp: 18     Height: 5\' 11"  (1.803 m)     Weight: 175 lb (79.379 kg)     SpO2: 99% 98% 97% 98%   Meds given  in ED:  Medications  HYDROmorphone (DILAUDID) injection 2 mg (2 mg Intramuscular Given 03/12/15 0425)  lidocaine (PF) (XYLOCAINE) 1 % injection 5 mL (5 mLs Intradermal Given 03/12/15 0429)    Discharge Medication List as of 03/12/2015  5:38 AM    START taking these medications   Details  azithromycin (ZITHROMAX) 250 MG tablet Take 1 tablet (250 mg total) by mouth daily. Take first 2 tablets together, then 1 every day until finished., Starting 03/12/2015, Until Discontinued, Print    !! oxyCODONE (ROXICODONE) 5 MG immediate release tablet Take 1 tablet (5 mg total) by mouth every 4 (four) hours as needed for severe pain., Starting 03/12/2015, Until Discontinued, Print     !! - Potential duplicate medications found. Please discuss with provider.         Britt Bottom, NP 03/12/15 Clarion Yao, MD 03/14/15 [phone]

## 2015-03-12 NOTE — Discharge Instructions (Signed)
Please follow directions provided. Be sure to follow-up with your primary care doctor or come back to the emergency room in 7 days to have your stitches taken out. Keep wound clean and dry and replace your dressing daily. Hesitate to return for any new, worsening, or concerning symptoms.   WHEN SHOULD I SEEK IMMEDIATE MEDICAL CARE?  You should get help right away if:  You have confusion or drowsiness.  You feel sick to your stomach (nauseous) or have continued, forceful vomiting.  You have dizziness or unsteadiness that is getting worse.  You have severe, continued headaches not relieved by medicine. Only take over-the-counter or prescription medicines for pain, fever, or discomfort as directed by your health care provider.  You do not have normal function of the arms or legs or are unable to walk.  You notice changes in the black spots in the center of the colored part of your eye (pupil).  You have a clear or bloody fluid coming from your nose or ears.  You have a loss of vision. During the next 24 hours after the injury, you must stay with someone who can watch you for the warning signs. This person should contact local emergency services (911 in the [place] if you have seizures, you become unconscious, or you are unable to wake up.

## 2015-07-29 ENCOUNTER — Other Ambulatory Visit: Payer: Self-pay | Admitting: Family Medicine

## 2015-07-29 DIAGNOSIS — M542 Cervicalgia: Secondary | ICD-10-CM

## 2015-08-29 ENCOUNTER — Other Ambulatory Visit: Payer: Self-pay | Admitting: Family Medicine

## 2015-08-29 DIAGNOSIS — M542 Cervicalgia: Secondary | ICD-10-CM

## 2015-08-29 DIAGNOSIS — S130XXA Traumatic rupture of cervical intervertebral disc, initial encounter: Secondary | ICD-10-CM

## 2015-08-30 ENCOUNTER — Ambulatory Visit
Admission: RE | Admit: 2015-08-30 | Discharge: 2015-08-30 | Disposition: A | Discharge status: Home / Self Care | Payer: Medicaid Other | Source: Ambulatory Visit | Attending: Family Medicine | Admitting: Family Medicine

## 2015-08-30 DIAGNOSIS — M542 Cervicalgia: Secondary | ICD-10-CM

## 2015-08-30 DIAGNOSIS — S130XXA Traumatic rupture of cervical intervertebral disc, initial encounter: Secondary | ICD-10-CM

## 2016-05-21 ENCOUNTER — Encounter (HOSPITAL_COMMUNITY): Payer: Self-pay

## 2016-05-22 ENCOUNTER — Encounter (HOSPITAL_COMMUNITY): Payer: Self-pay

## 2016-05-22 ENCOUNTER — Ambulatory Visit (HOSPITAL_COMMUNITY)
Admission: RE | Admit: 2016-05-22 | Discharge: 2016-05-22 | Disposition: A | Discharge status: Home / Self Care | Payer: Medicaid Other | Source: Ambulatory Visit | Attending: Obstetrics and Gynecology | Admitting: Obstetrics and Gynecology

## 2016-05-22 VITALS — BP 104/56 | HR 80 | Wt 200.4 lb

## 2016-05-22 DIAGNOSIS — O26892 Other specified pregnancy related conditions, second trimester: Secondary | ICD-10-CM | POA: Diagnosis not present

## 2016-05-22 DIAGNOSIS — G8929 Other chronic pain: Secondary | ICD-10-CM | POA: Diagnosis not present

## 2016-05-22 DIAGNOSIS — O99322 Drug use complicating pregnancy, second trimester: Secondary | ICD-10-CM | POA: Insufficient documentation

## 2016-05-22 DIAGNOSIS — Z3A26 26 weeks gestation of pregnancy: Secondary | ICD-10-CM | POA: Diagnosis not present

## 2016-05-22 DIAGNOSIS — Z86711 Personal history of pulmonary embolism: Secondary | ICD-10-CM | POA: Insufficient documentation

## 2016-05-22 DIAGNOSIS — F1120 Opioid dependence, uncomplicated: Secondary | ICD-10-CM | POA: Diagnosis not present
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
## 2016-09-21 ENCOUNTER — Encounter (HOSPITAL_COMMUNITY): Payer: Self-pay | Admitting: Emergency Medicine

## 2016-09-21 ENCOUNTER — Emergency Department (HOSPITAL_COMMUNITY): Payer: Medicaid Other

## 2016-09-21 ENCOUNTER — Emergency Department (HOSPITAL_COMMUNITY)
Admission: EM | Admit: 2016-09-21 | Discharge: 2016-09-22 | Disposition: A | Discharge status: Home / Self Care | Payer: Medicaid Other | Attending: Emergency Medicine | Admitting: Emergency Medicine

## 2016-09-21 DIAGNOSIS — O9943 Diseases of the circulatory system complicating the puerperium: Secondary | ICD-10-CM | POA: Insufficient documentation

## 2016-09-21 DIAGNOSIS — R079 Chest pain, unspecified: Secondary | ICD-10-CM

## 2016-09-21 DIAGNOSIS — M79605 Pain in left leg: Secondary | ICD-10-CM | POA: Insufficient documentation

## 2016-09-21 DIAGNOSIS — R0789 Other chest pain: Secondary | ICD-10-CM | POA: Diagnosis not present

## 2016-09-21 DIAGNOSIS — F17200 Nicotine dependence, unspecified, uncomplicated: Secondary | ICD-10-CM | POA: Insufficient documentation

## 2016-09-21 LAB — CBC
HCT: 38.4 % (ref 36.0–46.0)
HEMOGLOBIN: 12.9 g/dL (ref 12.0–15.0)
MCH: 31.9 pg (ref 26.0–34.0)
MCHC: 33.6 g/dL (ref 30.0–36.0)
MCV: 95 fL (ref 78.0–100.0)
Platelets: 334 10*3/uL (ref 150–400)
RBC: 4.04 MIL/uL (ref 3.87–5.11)
RDW: 14.3 % (ref 11.5–15.5)
WBC: 6.7 10*3/uL (ref 4.0–10.5)

## 2016-09-21 LAB — BASIC METABOLIC PANEL
ANION GAP: 6 (ref 5–15)
BUN: 7 mg/dL (ref 6–20)
CO2: 23 mmol/L (ref 22–32)
Calcium: 9.1 mg/dL (ref 8.9–10.3)
Chloride: 109 mmol/L (ref 101–111)
Creatinine, Ser: 0.76 mg/dL (ref 0.44–1.00)
GFR calc Af Amer: >60 mL/min (ref >60)
GFR calc non Af Amer: >60 mL/min (ref >60)
GLUCOSE: 80 mg/dL (ref 65–99)
Potassium: 3.9 mmol/L (ref 3.5–5.1)
Sodium: 138 mmol/L (ref 135–145)

## 2016-09-21 LAB — I-STAT TROPONIN, ED: TROPONIN I, POC: 0.02 ng/mL (ref 0.00–0.08)

## 2016-09-21 MED ORDER — IOPAMIDOL (ISOVUE-370) INJECTION 76%
INTRAVENOUS | Status: AC
  Administered 2016-09-21: 100 mL
  Filled 2016-09-21: qty 100

## 2016-09-21 NOTE — ED Triage Notes (Signed)
Also reports mild vaginal bleeding which has been persistent since child birth and explains that she fell about a week ago striking her right cheek. Right cheek appears bruised with palpable induration noted.

## 2016-09-21 NOTE — ED Triage Notes (Signed)
Patient arrives with complaint of persistent but intermittent shortness of breath coupled with left leg swelling. Onset of problems was 3-4 weeks ago after giving birth to child. Patient endorses history of PE in 2012. After child birth she was prescribed Lovenox 40 daily. States that until today she has had persistent lower leg swelling on the left, but today the swelling has resolved, however the pain remains in her calf. Deep breaths arouse mild chest pain and patient endorses feeling "constricted" when doing so.

## 2016-09-21 NOTE — ED Notes (Signed)
Dr Reather Converse in room

## 2016-09-21 NOTE — Discharge Instructions (Signed)
Return to have ultrasounds of your legs during the day.  If you were given medicines take as directed.  If you are on coumadin or contraceptives realize their levels and effectiveness is altered by many different medicines.  If you have any reaction (rash, tongues swelling, other) to the medicines stop taking and see a physician.    If your blood pressure was elevated in the ER make sure you follow up for management with a primary doctor or return for chest pain, shortness of breath or stroke symptoms.  Please follow up as directed and return to the ER or see a physician for new or worsening symptoms.  Thank you. Vitals:   09/21/16 2047 09/21/16 2048 09/21/16 2230  BP: 122/79  141/94  Pulse: 98  92  Resp: 16  18  Temp: 98.2 F (36.8 C)    TempSrc: Oral    SpO2: 99%  100%  Weight:  180 lb (81.6 kg)   Height:  5\' 10"  (1.778 m)

## 2016-09-21 NOTE — ED Provider Notes (Signed)
[facility] DEPT Provider Note   CSN: [id number] Arrival date & time: 09/21/16  2032     History   Chief Complaint Chief Complaint  Patient presents with  . Shortness of Breath  . Leg Swelling  . Chest Pain    HPI Peggy [name] is a 35 y.o. female.  Patient with history of pulmonary embolism and blood clots after last pregnancy presents with intermittent short of breath and chest pressure worse today. This feels similar to when she had blood clots 2012. Patient's been on Lovenox since her vaginal delivery 4 weeks prior. Patient has mild difficulty with taking a deep breath. No fevers or chills. Patient had mild bilateral lower extremity swelling with mild left calf tenderness which is improved without treatment. Mild focal tenderness left calf.    Shortness of Breath  Associated symptoms include chest pain and leg swelling. Pertinent negatives include no fever, no headaches, no neck pain, no vomiting, no abdominal pain and no rash.  Chest Pain   Associated symptoms include shortness of breath. Pertinent negatives include no abdominal pain, no back pain, no fever, no headaches and no vomiting.    Past Medical History:  Diagnosis Date  . Anxiety   . Back pain   . Fibroid   . Pregnancy induced hypertension   . Pulmonary embolism (HCC)     There are no active problems to display for this patient.   Past Surgical History:  Procedure Laterality Date  . DILATION AND CURETTAGE OF UTERUS    . TONSILLECTOMY      OB History    Gravida Para Term Preterm AB Living   5 2 2  0 2 3   SAB TAB Ectopic Multiple Live Births   2 0 0 1 3       Home Medications    Prior to Admission medications   Medication Sig Start Date End Date Taking? Authorizing Provider  buprenorphine (SUBUTEX) 8 MG SUBL SL tablet Place 8 mg under the tongue 2 (two) times daily. 08/08/16  Yes Historical Provider, MD  enoxaparin (LOVENOX) 40 MG/0.4ML injection Inject 40 mg into the skin daily.    Yes Historical Provider, MD  gabapentin (NEURONTIN) 600 MG tablet Take 600 mg by mouth 3 (three) times daily.   Yes Historical Provider, MD  naproxen sodium (ANAPROX) 220 MG tablet Take 220-440 mg by mouth 2 (two) times daily as needed (for pain).   Yes Historical Provider, MD  Prenatal Vit-Fe Fumarate-FA (PRENATAL/IRON) 28-0.8 MG TABS Take 1 tablet by mouth daily.   Yes Historical Provider, MD  azithromycin (ZITHROMAX) 250 MG tablet Take 1 tablet (250 mg total) by mouth daily. Take first 2 tablets together, then 1 every day until finished. Patient not taking: Reported on 09/21/2016 03/12/15   Britt Bottom, NP  DPH-Lido-AlHydr-MgHydr-Simeth [facility]) SUSP Use as directed 10 mLs in the mouth or throat 3 (three) times daily as needed (mouth pain). Patient not taking: Reported on 09/21/2016 11/20/14   Mercedes Camprubi-Soms, PA-C  methocarbamol (ROBAXIN) 500 MG tablet Take 2 tablets (1,000 mg total) by mouth 4 (four) times daily. Patient not taking: Reported on 09/21/2016 07/22/14   Carlisle Cater, PA-C  oxyCODONE (ROXICODONE) 5 MG immediate release tablet Take 1 tablet (5 mg total) by mouth every 4 (four) hours as needed for severe pain. Patient not taking: Reported on 09/21/2016 03/12/15   Britt Bottom, NP    Family History Family History  Problem Relation Age of Onset  . Anesthesia problems Neg Hx   .  Hypotension Neg Hx   . Malignant hyperthermia Neg Hx   . Pseudochol deficiency Neg Hx     Social History Social History  Substance Use Topics  . Smoking status: Current Every Day Smoker    Packs/day: 0.25  . Smokeless tobacco: Never Used  . Alcohol use Yes     Allergies   Tramadol and Ibuprofen   Review of Systems Review of Systems  Constitutional: Negative for chills and fever.  HENT: Negative for congestion.   Eyes: Negative for visual disturbance.  Respiratory: Positive for shortness of breath.   Cardiovascular: Positive for chest pain and leg swelling.    Gastrointestinal: Negative for abdominal pain and vomiting.  Genitourinary: Negative for dysuria and flank pain.  Musculoskeletal: Negative for back pain, neck pain and neck stiffness.  Skin: Negative for rash.  Neurological: Negative for light-headedness and headaches.     Physical Exam Updated Vital Signs BP 106/74   Pulse 86   Temp 98.2 F (36.8 C) (Oral)   Resp 15   Ht 5\' 10"  (1.778 m)   Wt 180 lb (81.6 kg)   SpO2 100%   Breastfeeding? No Comment: persisten bleeding since pregnancy.   BMI 25.83 kg/m   Physical Exam  Constitutional: She is oriented to person, place, and time. She appears well-developed and well-nourished.  HENT:  Head: Normocephalic and atraumatic.  Eyes: Conjunctivae are normal. Right eye exhibits no discharge. Left eye exhibits no discharge.  Neck: Normal range of motion. Neck supple. No tracheal deviation present.  Cardiovascular: Normal rate and regular rhythm.   Pulmonary/Chest: Effort normal and breath sounds normal.  Abdominal: Soft. She exhibits no distension. There is no tenderness. There is no guarding.  Musculoskeletal: She exhibits tenderness (mild left calfmild left calf). She exhibits no edema.  Neurological: She is alert and oriented to person, place, and time.  Skin: Skin is warm. No rash noted.  Psychiatric: She has a normal mood and affect.  Nursing note and vitals reviewed.    ED Treatments / Results  Labs (all labs ordered are listed, but only abnormal results are displayed) Labs Reviewed  BASIC METABOLIC PANEL  CBC  I-STAT [place], ED  I-STAT TROPOININ, ED    EKG  EKG Interpretation  Date/Time:  Friday September 21 2016 20:38:02 EDT Ventricular Rate:  93 PR Interval:  142 QRS Duration: 82 QT Interval:  350 QTC Calculation: 435 R Axis:   66 Text Interpretation:  Normal sinus rhythm with sinus arrhythmia Normal ECG poor baseline Confirmed by Patric Buckhalter MD, Taneeka Curtner [phone]) on 09/21/2016 10:06:50 PM       Radiology Dg  Chest 2 View  Result Date: 09/21/2016 CLINICAL DATA:  Subacute onset of intermittent shortness of breath and left leg swelling. Initial encounter. EXAM: CHEST  2 VIEW COMPARISON:  Chest radiograph performed 11/20/2014 FINDINGS: The lungs are well-aerated and clear. There is no evidence of focal opacification, pleural effusion or pneumothorax. The heart is normal in size; the mediastinal contour is within normal limits. No acute osseous abnormalities are seen. IMPRESSION: No acute cardiopulmonary process seen. Electronically Signed   By: Garald Balding M.D.   On: 09/21/2016 21:35   Ct Angio Chest Pe W And/or Wo Contrast  Result Date: 09/22/2016 CLINICAL DATA:  Shortness of breath. History of pulmonary embolus and pregnancy-induced hypertension. EXAM: CT ANGIOGRAPHY CHEST WITH CONTRAST TECHNIQUE: Multidetector CT imaging of the chest was performed using the standard protocol during bolus administration of intravenous contrast. Multiplanar CT image reconstructions and MIPs were obtained to evaluate  the vascular anatomy. CONTRAST:  100 mL Isovue 370 COMPARISON:  11/07/2011 FINDINGS: Cardiovascular: Satisfactory opacification of the pulmonary arteries to the segmental level. No evidence of pulmonary embolism. Normal heart size. No pericardial effusion. Mediastinum/Nodes: Scattered lymph nodes in the mediastinum are not pathologically enlarged and likely reactive. Esophagus is decompressed. Thyroid gland is normal. Lungs/Pleura: Hazy nonsegmental opacities of the posterior lungs bilaterally could represent dependent atelectasis or edema. Pneumonia is less likely due distribution. No pleural effusions. No pneumothorax. Airways appear patent. Upper Abdomen: No acute abnormality. Musculoskeletal: No chest wall abnormality. No acute or significant osseous findings. Review of the MIP images confirms the above findings. IMPRESSION: No evidence of significant pulmonary embolus. Hazy nonsegmental opacities in the posterior  lungs bilaterally could represent dependent atelectasis or edema. Electronically Signed   By: Lucienne Capers M.D.   On: 09/22/2016 00:20    Procedures Procedures (including critical care time)  Medications Ordered in ED Medications  iopamidol (ISOVUE-370) 76 % injection (100 mLs  Contrast Given 09/21/16 2351)     Initial Impression / Assessment and Plan / ED Course  I have reviewed the triage vital signs and the nursing notes.  Pertinent labs & imaging results that were available during my care of the patient were reviewed by me and considered in my medical decision making (see chart for details).  Clinical Course   Patient with blood clot history currently on Lovenox presents with intermittent shortness of breath and pressure. Patient is well-appearing in the room no increased work of breathing not requiring oxygen. With history of possible rambles of plan for CT angio the chest.  Patient will need to come back during the daytime to have ultrasound of her left lower extremity.  Clarified with patient her vaginal bleeding has resolved recently.  No abdo pain.  CT scan no significant pulmonary embolism. Patient's pain controlled. Plan for delta troponin outpatient follow-up.  Final Clinical Impressions(s) / ED Diagnoses   Final diagnoses:  Acute chest pain    New Prescriptions New Prescriptions   No medications on file     Elnora Morrison, MD 09/22/16 [phone]

## 2016-09-22 ENCOUNTER — Ambulatory Visit (HOSPITAL_COMMUNITY)
Admission: RE | Admit: 2016-09-22 | Discharge: 2016-09-22 | Disposition: A | Discharge status: Home / Self Care | Payer: Medicaid Other | Source: Ambulatory Visit | Attending: Emergency Medicine | Admitting: Emergency Medicine

## 2016-09-22 DIAGNOSIS — M79609 Pain in unspecified limb: Secondary | ICD-10-CM | POA: Diagnosis not present

## 2016-09-22 DIAGNOSIS — M79662 Pain in left lower leg: Secondary | ICD-10-CM | POA: Diagnosis present

## 2016-09-22 LAB — I-STAT TROPONIN, ED: Troponin i, poc: 0 ng/mL (ref 0.00–0.08)

## 2016-09-22 NOTE — ED Notes (Signed)
Pt verbalized understanding of d/c instructions and has no further questions. Pt stable and NAD. Pt is to come back in the morning for vas ultrasound of left leg.

## 2016-09-22 NOTE — ED Notes (Signed)
Pt transported to CT ?

## 2016-09-22 NOTE — Progress Notes (Signed)
VASCULAR LAB PRELIMINARY  PRELIMINARY  PRELIMINARY  PRELIMINARY  Left lower extremity venous duplex completed.    Preliminary report:  There is no DVT or SVT noted in the left lower extremity.   Kadey Mihalic, RVT 09/22/2016, 6:53 PM

## 2017-04-03 ENCOUNTER — Encounter (HOSPITAL_COMMUNITY): Payer: Self-pay | Admitting: *Deleted

## 2017-04-03 ENCOUNTER — Emergency Department (HOSPITAL_COMMUNITY)
Admission: EM | Admit: 2017-04-03 | Discharge: 2017-04-03 | Disposition: A | Discharge status: Home / Self Care | Payer: Medicaid Other | Attending: Emergency Medicine | Admitting: Emergency Medicine

## 2017-04-03 DIAGNOSIS — K0889 Other specified disorders of teeth and supporting structures: Secondary | ICD-10-CM | POA: Diagnosis present

## 2017-04-03 DIAGNOSIS — K029 Dental caries, unspecified: Secondary | ICD-10-CM | POA: Insufficient documentation

## 2017-04-03 DIAGNOSIS — F17200 Nicotine dependence, unspecified, uncomplicated: Secondary | ICD-10-CM | POA: Diagnosis not present

## 2017-04-03 MED ORDER — PENICILLIN V POTASSIUM 250 MG PO TABS
500.0000 mg | ORAL_TABLET | Freq: Once | ORAL | Status: AC
  Administered 2017-04-03: 500 mg via ORAL
  Filled 2017-04-03: qty 2

## 2017-04-03 MED ORDER — PENICILLIN V POTASSIUM 500 MG PO TABS: 500.0000 mg | ORAL_TABLET | Freq: Four times a day (QID) | ORAL | 0 refills | Status: AC

## 2017-04-03 NOTE — Discharge Instructions (Signed)
Please read attached information. If you experience any new or worsening signs or symptoms please return to the emergency room for evaluation. Please follow-up with your primary care provider or specialist as discussed. Please use medication prescribed only as directed and discontinue taking if you have any concerning signs or symptoms.   °

## 2017-04-03 NOTE — ED Provider Notes (Signed)
[facility] DEPT Provider Note   CSN: [id number] Arrival date & time: 04/03/17  0136     History   Chief Complaint Chief Complaint  Patient presents with  . Dental Pain    HPI Peggy [name] is a 36 y.o. female.  HPI   36 year old female presents today with left upper molar dental pain.  Patient reports numerous dental caries with fractured teeth.  Patient denies any swelling or edema, denies any fever or asymmetry of the jaw.  Patient reports she was instructed to come to the emergency room to get antibiotics before seeing the dentist.  She has no other complaints.  Patient reports using ibuprofen at home with some symptomatic relief. Past Medical History:  Diagnosis Date  . Anxiety   . Back pain   . Fibroid   . Pregnancy induced hypertension   . Pulmonary embolism (HCC)     There are no active problems to display for this patient.   Past Surgical History:  Procedure Laterality Date  . DILATION AND CURETTAGE OF UTERUS    . TONSILLECTOMY      OB History    Gravida Para Term Preterm AB Living   5 2 2  0 2 3   SAB TAB Ectopic Multiple Live Births   2 0 0 1 3       Home Medications    Prior to Admission medications   Medication Sig Start Date End Date Taking? Authorizing Provider  azithromycin (ZITHROMAX) 250 MG tablet Take 1 tablet (250 mg total) by mouth daily. Take first 2 tablets together, then 1 every day until finished. Patient not taking: Reported on 09/21/2016 03/12/15   Britt Bottom, NP  buprenorphine (SUBUTEX) 8 MG SUBL SL tablet Place 8 mg under the tongue 2 (two) times daily. 08/08/16   Historical Provider, MD  DPH-Lido-AlHydr-MgHydr-Simeth (FIRST-MOUTHWASH BLM) SUSP Use as directed 10 mLs in the mouth or throat 3 (three) times daily as needed (mouth pain). Patient not taking: Reported on 09/21/2016 11/20/14   [place], PA-C  enoxaparin (LOVENOX) 40 MG/0.4ML injection Inject 40 mg into the skin daily.    Historical Provider, MD    gabapentin (NEURONTIN) 600 MG tablet Take 600 mg by mouth 3 (three) times daily.    Historical Provider, MD  methocarbamol (ROBAXIN) 500 MG tablet Take 2 tablets (1,000 mg total) by mouth 4 (four) times daily. Patient not taking: Reported on 09/21/2016 07/22/14   Carlisle Cater, PA-C  naproxen sodium (ANAPROX) 220 MG tablet Take 220-440 mg by mouth 2 (two) times daily as needed (for pain).    Historical Provider, MD  oxyCODONE (ROXICODONE) 5 MG immediate release tablet Take 1 tablet (5 mg total) by mouth every 4 (four) hours as needed for severe pain. Patient not taking: Reported on 09/21/2016 03/12/15   Britt Bottom, NP  penicillin v potassium (VEETID) 500 MG tablet Take 1 tablet (500 mg total) by mouth 4 (four) times daily. 04/03/17 04/10/17  Okey Regal, PA-C  Prenatal Vit-Fe Fumarate-FA (PRENATAL/IRON) 28-0.8 MG TABS Take 1 tablet by mouth daily.    Historical Provider, MD    Family History Family History  Problem Relation Age of Onset  . Anesthesia problems Neg Hx   . Hypotension Neg Hx   . Malignant hyperthermia Neg Hx   . Pseudochol deficiency Neg Hx     Social History Social History  Substance Use Topics  . Smoking status: Current Every Day Smoker    Packs/day: 0.25  . Smokeless tobacco: Never Used  .  Alcohol use Yes     Allergies   Tramadol and Ibuprofen   Review of Systems Review of Systems  All other systems reviewed and are negative.    Physical Exam Updated Vital Signs BP (!) 140/91   Pulse 97   Temp 98.5 F (36.9 C) (Oral)   Resp 18   LMP 03/17/2017   SpO2 98%   Physical Exam  Constitutional: She is oriented to person, place, and time. She appears well-developed and well-nourished.  HENT:  Head: Normocephalic and atraumatic.  Numerous dental caries and fractured teeth.  No obvious swelling is along the gumline, for the mouth, or posterior oropharynx.  Jaw symmetrical full active range of motion  Eyes: Conjunctivae are normal. Pupils are equal,  round, and reactive to light. Right eye exhibits no discharge. Left eye exhibits no discharge. No scleral icterus.  Neck: Normal range of motion. No JVD present. No tracheal deviation present.  Pulmonary/Chest: Effort normal. No stridor.  Neurological: She is alert and oriented to person, place, and time. Coordination normal.  Psychiatric: She has a normal mood and affect. Her behavior is normal. Judgment and thought content normal.  Nursing note and vitals reviewed.    ED Treatments / Results  Labs (all labs ordered are listed, but only abnormal results are displayed) Labs Reviewed - No data to display  EKG  EKG Interpretation None       Radiology No results found.  Procedures Procedures (including critical care time)  Medications Ordered in ED Medications  penicillin v potassium (VEETID) tablet 500 mg (500 mg Oral Given 04/03/17 0333)     Initial Impression / Assessment and Plan / ED Course  I have reviewed the triage vital signs and the nursing notes.  Pertinent labs & imaging results that were available during my care of the patient were reviewed by me and considered in my medical decision making (see chart for details).     Patient presents with uncomplicated dental pain requesting antibiotics.  I feel this is reasonable in this patient.  She will be started on penicillin with close follow-up with dental resources.  Return precautions given.  She verbalized understanding and agreement to today's plan.  Final Clinical Impressions(s) / ED Diagnoses   Final diagnoses:  Pain, dental    New Prescriptions Discharge Medication List as of 04/03/2017  3:20 AM    START taking these medications   Details  penicillin v potassium (VEETID) 500 MG tablet Take 1 tablet (500 mg total) by mouth 4 (four) times daily., Starting Wed 04/03/2017, Until Wed 04/10/2017, Print         [employer], PA-C 04/03/17 [place], MD 04/03/17 [phone]

## 2017-04-03 NOTE — ED Notes (Signed)
E-signature pad not available.  Pt verbalizes understanding of discharge paperwork.

## 2017-04-03 NOTE — ED Triage Notes (Signed)
Pt requesting an antibiotic for dental pain. Pt started having dental pain yesterday, called her dentist, but was unable to be seen. Pt has been taking tylenol for pain without relief

## 2018-01-27 ENCOUNTER — Other Ambulatory Visit: Payer: Self-pay

## 2018-01-27 ENCOUNTER — Emergency Department (HOSPITAL_COMMUNITY)
Admission: EM | Admit: 2018-01-27 | Discharge: 2018-01-27 | Disposition: A | Discharge status: Home / Self Care | Payer: Medicaid Other | Attending: Emergency Medicine | Admitting: Emergency Medicine

## 2018-01-27 ENCOUNTER — Encounter (HOSPITAL_COMMUNITY): Payer: Self-pay

## 2018-01-27 DIAGNOSIS — Z7901 Long term (current) use of anticoagulants: Secondary | ICD-10-CM | POA: Insufficient documentation

## 2018-01-27 DIAGNOSIS — F17210 Nicotine dependence, cigarettes, uncomplicated: Secondary | ICD-10-CM | POA: Insufficient documentation

## 2018-01-27 DIAGNOSIS — Z79899 Other long term (current) drug therapy: Secondary | ICD-10-CM | POA: Insufficient documentation

## 2018-01-27 DIAGNOSIS — K047 Periapical abscess without sinus: Secondary | ICD-10-CM | POA: Insufficient documentation

## 2018-01-27 DIAGNOSIS — K0889 Other specified disorders of teeth and supporting structures: Secondary | ICD-10-CM | POA: Diagnosis present

## 2018-01-27 MED ORDER — PENICILLIN V POTASSIUM 500 MG PO TABS: 500.0000 mg | ORAL_TABLET | Freq: Four times a day (QID) | ORAL | 0 refills | Status: AC

## 2018-01-27 MED ORDER — LIDOCAINE VISCOUS 2 % MT SOLN: 20.0000 mL | OROMUCOSAL | 0 refills | Status: DC | PRN

## 2018-01-27 MED ORDER — IBUPROFEN 800 MG PO TABS: 800.0000 mg | ORAL_TABLET | Freq: Three times a day (TID) | ORAL | 0 refills | Status: AC

## 2018-01-27 NOTE — ED Provider Notes (Signed)
[place] [facility] EMERGENCY DEPARTMENT Provider Note   CSN: [id number] Arrival date & time: 01/27/18  1839     History   Chief Complaint Chief Complaint  Patient presents with  . Dental Pain    HPI Peggy [name] is a 37 y.o. female presenting for evaluation of tooth pain.  Pt states that for the past several months, she has had intermittent left lower tooth pain.  Over the past week and a half, it has become more persistent and more severe.  She has been using Orajel without improvement.  She had ibuprofen 800 pills, which improved her pain, but she ran out of the medicine.  She reports subjective fever at home, facial swelling, and neck pain.  She denies difficulty breathing, occulta handling secretions, pain with opening her mouth, vision changes, ear pain, chest pain, shortness of breath, nausea, or vomiting.  She denies any other medical problems, does not take medications daily.  She denies immunocompromised or history of diabetes.  She reports she had similar dental pain last year, which was treated successfully with penicillin.  She has followed up with the oral surgeon, Dr. Hoyt Koch, and will continue to do so after the infection is treated today.  HPI  Past Medical History:  Diagnosis Date  . Anxiety   . Back pain   . Fibroid   . Pregnancy induced hypertension   . Pulmonary embolism (HCC)     There are no active problems to display for this patient.   Past Surgical History:  Procedure Laterality Date  . DILATION AND CURETTAGE OF UTERUS    . TONSILLECTOMY      OB History    Gravida Para Term Preterm AB Living   5 2 2  0 2 3   SAB TAB Ectopic Multiple Live Births   2 0 0 1 3       Home Medications    Prior to Admission medications   Medication Sig Start Date End Date Taking? Authorizing Provider  azithromycin (ZITHROMAX) 250 MG tablet Take 1 tablet (250 mg total) by mouth daily. Take first 2 tablets together, then 1 every day until  finished. Patient not taking: Reported on 09/21/2016 03/12/15   Britt Bottom, NP  buprenorphine (SUBUTEX) 8 MG SUBL SL tablet Place 8 mg under the tongue 2 (two) times daily. 08/08/16   [provider]  DPH-Lido-AlHydr-MgHydr-Simeth (FIRST-MOUTHWASH BLM) SUSP Use as directed 10 mLs in the mouth or throat 3 (three) times daily as needed (mouth pain). Patient not taking: Reported on 09/21/2016 11/20/14   Street, [place], PA-C  enoxaparin (LOVENOX) 40 MG/0.4ML injection Inject 40 mg into the skin daily.    [provider]  gabapentin (NEURONTIN) 600 MG tablet Take 600 mg by mouth 3 (three) times daily.    [provider]  ibuprofen (ADVIL,MOTRIN) 800 MG tablet Take 1 tablet (800 mg total) by mouth 3 (three) times daily with meals for 7 days. 01/27/18 02/03/18  Ronald Vinsant, PA-C  lidocaine (XYLOCAINE) 2 % solution Use as directed 20 mLs in the mouth or throat as needed for mouth pain. 01/27/18   Gilbert Manolis, PA-C  methocarbamol (ROBAXIN) 500 MG tablet Take 2 tablets (1,000 mg total) by mouth 4 (four) times daily. Patient not taking: Reported on 09/21/2016 07/22/14   Carlisle Cater, PA-C  naproxen sodium (ANAPROX) 220 MG tablet Take 220-440 mg by mouth 2 (two) times daily as needed (for pain).    [provider]  oxyCODONE (ROXICODONE) 5 MG immediate release  tablet Take 1 tablet (5 mg total) by mouth every 4 (four) hours as needed for severe pain. Patient not taking: Reported on 09/21/2016 03/12/15   Britt Bottom, NP  penicillin v potassium (VEETID) 500 MG tablet Take 1 tablet (500 mg total) by mouth 4 (four) times daily for 7 days. 01/27/18 02/03/18  Zoii Florer, PA-C  Prenatal Vit-Fe Fumarate-FA (PRENATAL/IRON) 28-0.8 MG TABS Take 1 tablet by mouth daily.    [provider]    Family History Family History  Problem Relation Age of Onset  . Anesthesia problems Neg Hx   . Hypotension Neg Hx   . Malignant hyperthermia Neg Hx   .  Pseudochol deficiency Neg Hx     Social History Social History   Tobacco Use  . Smoking status: Current Every Day Smoker    Packs/day: 0.25  . Smokeless tobacco: Never Used  Substance Use Topics  . Alcohol use: Yes    Comment: occ  . Drug use: No     Allergies   Tramadol and Ibuprofen   Review of Systems Review of Systems  Constitutional: Positive for fever (subjective).  HENT: Positive for dental problem and facial swelling.   Musculoskeletal: Positive for neck pain.     Physical Exam Updated Vital Signs BP 119/63   Pulse 78   Temp 98.5 F (36.9 C) (Oral)   Resp 18   LMP 01/09/2018 (Exact Date)   SpO2 100%   Physical Exam  Constitutional: She is oriented to person, place, and time. She appears well-developed and well-nourished. No distress.  Patient appears well and in no apparent distress.  Talking and moving her head and mouth without difficulty.  HENT:  Head: Normocephalic and atraumatic.  Right Ear: Tympanic membrane, external ear and ear canal normal.  Left Ear: Tympanic membrane, external ear and ear canal normal.  Nose: Nose normal.  Mouth/Throat: Uvula is midline, oropharynx is clear and moist and mucous membranes are normal. No trismus in the jaw. Abnormal dentition. Dental caries present. No dental abscesses or uvula swelling.    Full broken teeth and poor addition.  Multiple dental caries.  Left lower tooth broken with surrounding gingival erythema and edema.  No obvious dental abscess.  No trismus or difficulty handling secretions.  No obvious facial swelling.  Patient moving her head and neck throughout exam without difficulty.  No tenderness palpation of the neck.  No pain under the tongue.  Uvula midline with equal palate rise.  Eyes: EOM are normal.  Neck: Normal range of motion.  Cardiovascular: Normal rate, regular rhythm and intact distal pulses.  Pulmonary/Chest: Effort normal and breath sounds normal. No respiratory distress. She has no  wheezes.  Abdominal: She exhibits no distension.  Musculoskeletal: Normal range of motion.  Neurological: She is alert and oriented to person, place, and time.  Skin: Skin is warm. No rash noted.  Psychiatric: She has a normal mood and affect.  Nursing note and vitals reviewed.    ED Treatments / Results  Labs (all labs ordered are listed, but only abnormal results are displayed) Labs Reviewed - No data to display  EKG  EKG Interpretation None       Radiology No results found.  Procedures Procedures (including critical care time)  Medications Ordered in ED Medications - No data to display   Initial Impression / Assessment and Plan / ED Course  I have reviewed the triage vital signs and the nursing notes.  Pertinent labs & imaging results that were available during  my care of the patient were reviewed by me and considered in my medical decision making (see chart for details).     Patient presenting for evaluation of tooth pain.  Physical exam reassuring, she is afebrile not tachycardic.  She appears nontoxic.  She is moving her head and talking without difficulty.  No visible facial swelling. Left lower tooth eroded and surrounding gum erythema and edema.  No obvious abscess.  No physical signs of Ludwig's despite patient's description of symptoms.  At this time, I believe patient is safe for discharge with antibiotics and over-the-counter medication.  Patient to follow-up with Dr. Hoyt Koch as needed.  Return precautions given.  Patient states she understands and agrees to plan.   Final Clinical Impressions(s) / ED Diagnoses   Final diagnoses:  Dental infection    ED Discharge Orders        Ordered    ibuprofen (ADVIL,MOTRIN) 800 MG tablet  3 times daily with meals     01/27/18 1952    penicillin v potassium (VEETID) 500 MG tablet  4 times daily     01/27/18 1952    lidocaine (XYLOCAINE) 2 % solution  As needed     01/27/18 [place], Jaryah Aracena,  PA-C 01/28/18 0117    Fatima Blank, MD 01/29/18 [phone]

## 2018-01-27 NOTE — ED Triage Notes (Signed)
Pt has broken tooth on bottom left. States she has been having fever. States she needs abx. No facial swelling noted, does reports some neck pain.

## 2018-01-27 NOTE — Discharge Instructions (Signed)
Take antibiotics as prescribed.  Take the entire course of antibiotics, even if your symptoms improve. Take ibuprofen 3 times a day with meals.  Do not take other anti-inflammatories at the same time open (Advil, Motrin, naproxen, Aleve). You may supplement with Tylenol if you need further pain control. You may use viscous lidocaine as needed for pain. Follow-up with Dr. Hoyt Koch for further evaluation.  There is also information about other dentists in the area. Return to the emergency room if you develop persistent high fevers, worsening pain, difficulty swallowing, difficulty opening her mouth, or any new or worsening symptoms.

## 2018-01-27 NOTE — ED Notes (Signed)
Patient is in [place] with her child

## 2018-03-23 ENCOUNTER — Encounter (HOSPITAL_COMMUNITY): Payer: Self-pay

## 2018-03-23 ENCOUNTER — Emergency Department (HOSPITAL_COMMUNITY)
Admission: EM | Admit: 2018-03-23 | Discharge: 2018-03-23 | Disposition: A | Discharge status: Home / Self Care | Payer: Medicaid Other | Attending: Emergency Medicine | Admitting: Emergency Medicine

## 2018-03-23 DIAGNOSIS — R05 Cough: Secondary | ICD-10-CM | POA: Diagnosis not present

## 2018-03-23 DIAGNOSIS — Z86718 Personal history of other venous thrombosis and embolism: Secondary | ICD-10-CM | POA: Insufficient documentation

## 2018-03-23 DIAGNOSIS — R0981 Nasal congestion: Secondary | ICD-10-CM | POA: Insufficient documentation

## 2018-03-23 DIAGNOSIS — Z79899 Other long term (current) drug therapy: Secondary | ICD-10-CM | POA: Insufficient documentation

## 2018-03-23 DIAGNOSIS — F17200 Nicotine dependence, unspecified, uncomplicated: Secondary | ICD-10-CM | POA: Diagnosis not present

## 2018-03-23 DIAGNOSIS — R52 Pain, unspecified: Secondary | ICD-10-CM | POA: Diagnosis not present

## 2018-03-23 DIAGNOSIS — R509 Fever, unspecified: Secondary | ICD-10-CM | POA: Diagnosis not present

## 2018-03-23 DIAGNOSIS — R6889 Other general symptoms and signs: Secondary | ICD-10-CM

## 2018-03-23 MED ORDER — OSELTAMIVIR PHOSPHATE 75 MG PO CAPS: 75.0000 mg | ORAL_CAPSULE | Freq: Two times a day (BID) | ORAL | 0 refills | Status: DC

## 2018-03-23 MED ORDER — PROMETHAZINE-DM 6.25-15 MG/5ML PO SYRP: 5.0000 mL | ORAL_SOLUTION | Freq: Four times a day (QID) | ORAL | 0 refills | Status: DC | PRN

## 2018-03-23 MED ORDER — FLUTICASONE PROPIONATE 50 MCG/ACT NA SUSP: 1.0000 | Freq: Every day | NASAL | 0 refills | Status: DC

## 2018-03-23 MED ORDER — ONDANSETRON HCL 4 MG PO TABS: 4.0000 mg | ORAL_TABLET | Freq: Three times a day (TID) | ORAL | 0 refills | Status: DC | PRN

## 2018-03-23 NOTE — ED Triage Notes (Signed)
Patient complains of body aches, chills, fever since last pm. No relief with otc meds. Alert and oriented

## 2018-03-23 NOTE — Discharge Instructions (Signed)
You likely have a viral illness, possible the flu.  This should be treated symptomatically. Use Tylenol or ibuprofen as needed for fevers or body aches. Use Flonase daily for nasal congestion and cough. Take tamiflu twice a day for 5 days. Use zofran as needed for nausea or vomiting.  Use cough syrup as needed. Make sure you stay well-hydrated with water. Wash your hands frequently to prevent spread of infection. Follow-up with your primary care doctor in 1 week if your symptoms are not improving. Return to the emergency room if you develop chest pain, difficulty breathing, or any new or worsening symptoms.

## 2018-03-23 NOTE — ED Notes (Signed)
Declined W/C at D/C and was escorted to lobby by RN. 

## 2018-03-23 NOTE — ED Provider Notes (Signed)
[facility] EMERGENCY DEPARTMENT Provider Note   CSN: [id number] Arrival date & time: 03/23/18  1714     History   Chief Complaint Chief Complaint  Patient presents with  . congestion/ body aches    HPI Peggy [name] is a 37 y.o. female presenting for evaluation of nasal congestion, body aches, cough, and fever.  Patient states her symptoms began last night.  Began all of a sudden.  She has nasal congestion, fever, and generalized body aches.  She has a mildly productive cough with associated sore throat.  She states this feels just like when she has had the flu in the past.  She states her husband developed similar symptoms this morning.  She has been exposed to strep throat and flu at her house in the past week.  She denies ear pain, eye pain, sinus pain or pressure, difficulty breathing, chest pain, shortness of breath, nausea, vomiting, abdominal pain, urinary symptoms, abnormal bowel movements.  She does not have a history of COPD or asthma.  She does not smoke.  She has taken Tylenol, ibuprofen, DayQuil with mild improvement of her symptoms.  HPI  Past Medical History:  Diagnosis Date  . Anxiety   . Back pain   . Fibroid   . Pregnancy induced hypertension   . Pulmonary embolism (HCC)     There are no active problems to display for this patient.   Past Surgical History:  Procedure Laterality Date  . DILATION AND CURETTAGE OF UTERUS    . TONSILLECTOMY       OB History    Gravida  5   Para  2   Term  2   Preterm  0   AB  2   Living  3     SAB  2   TAB  0   Ectopic  0   Multiple  1   Live Births  3            Home Medications    Prior to Admission medications   Medication Sig Start Date End Date Taking? Authorizing Provider  azithromycin (ZITHROMAX) 250 MG tablet Take 1 tablet (250 mg total) by mouth daily. Take first 2 tablets together, then 1 every day until finished. Patient not taking: Reported on 09/21/2016  03/12/15   Britt Bottom, NP  buprenorphine (SUBUTEX) 8 MG SUBL SL tablet Place 8 mg under the tongue 2 (two) times daily. 08/08/16   [provider]  DPH-Lido-AlHydr-MgHydr-Simeth (FIRST-MOUTHWASH BLM) SUSP Use as directed 10 mLs in the mouth or throat 3 (three) times daily as needed (mouth pain). Patient not taking: Reported on 09/21/2016 11/20/14   Street, [place], PA-C  enoxaparin (LOVENOX) 40 MG/0.4ML injection Inject 40 mg into the skin daily.    [provider]  fluticasone (FLONASE) 50 MCG/ACT nasal spray Place 1 spray into both nostrils daily. 03/23/18   Arthuro Canelo, PA-C  gabapentin (NEURONTIN) 600 MG tablet Take 600 mg by mouth 3 (three) times daily.    [provider]  lidocaine (XYLOCAINE) 2 % solution Use as directed 20 mLs in the mouth or throat as needed for mouth pain. 01/27/18   Edgerrin Correia, PA-C  methocarbamol (ROBAXIN) 500 MG tablet Take 2 tablets (1,000 mg total) by mouth 4 (four) times daily. Patient not taking: Reported on 09/21/2016 07/22/14   Carlisle Cater, PA-C  naproxen sodium (ANAPROX) 220 MG tablet Take 220-440 mg by mouth 2 (two) times daily as needed (for pain).  [provider]  ondansetron (ZOFRAN) 4 MG tablet Take 1 tablet (4 mg total) by mouth every 8 (eight) hours as needed for nausea or vomiting. 03/23/18   Azuree Minish, PA-C  oseltamivir (TAMIFLU) 75 MG capsule Take 1 capsule (75 mg total) by mouth every 12 (twelve) hours. 03/23/18   Suvi Archuletta, PA-C  oxyCODONE (ROXICODONE) 5 MG immediate release tablet Take 1 tablet (5 mg total) by mouth every 4 (four) hours as needed for severe pain. Patient not taking: Reported on 09/21/2016 03/12/15   Britt Bottom, NP  Prenatal Vit-Fe Fumarate-FA (PRENATAL/IRON) 28-0.8 MG TABS Take 1 tablet by mouth daily.    [provider]  promethazine-dextromethorphan (PROMETHAZINE-DM) 6.25-15 MG/5ML syrup Take 5 mLs by mouth 4 (four) times daily as needed. 03/23/18    Isack Lavalley, PA-C    Family History Family History  Problem Relation Age of Onset  . Anesthesia problems Neg Hx   . Hypotension Neg Hx   . Malignant hyperthermia Neg Hx   . Pseudochol deficiency Neg Hx     Social History Social History   Tobacco Use  . Smoking status: Current Every Day Smoker    Packs/day: 0.25  . Smokeless tobacco: Never Used  Substance Use Topics  . Alcohol use: Yes    Comment: occ  . Drug use: No     Allergies   Tramadol and Ibuprofen   Review of Systems Review of Systems  Constitutional: Positive for fever.  HENT: Positive for congestion and sore throat.   Respiratory: Positive for cough.      Physical Exam Updated Vital Signs BP 131/80 (BP Location: Right Arm)   Pulse (!) 109   Temp 99.1 F (37.3 C) (Oral)   Resp 18   SpO2 100%   Physical Exam  Constitutional: She is oriented to person, place, and time. She appears well-developed and well-nourished. No distress.  HENT:  Head: Normocephalic and atraumatic.  Right Ear: Tympanic membrane, external ear and ear canal normal.  Left Ear: Tympanic membrane, external ear and ear canal normal.  Nose: Mucosal edema present. Right sinus exhibits no maxillary sinus tenderness and no frontal sinus tenderness. Left sinus exhibits no maxillary sinus tenderness and no frontal sinus tenderness.  Mouth/Throat: Uvula is midline, oropharynx is clear and moist and mucous membranes are normal. No tonsillar exudate.  Nasal mucosal edema.  OP clear without swelling or exudate.  Uvula midline with equal palate rise.  TMs nonerythematous and not bulging bilaterally.  Eyes: Pupils are equal, round, and reactive to light. Conjunctivae and EOM are normal.  Neck: Normal range of motion.  Cardiovascular: Regular rhythm and intact distal pulses.  mildly tachycardic ~105  Pulmonary/Chest: Effort normal and breath sounds normal. She has no decreased breath sounds. She has no wheezes. She has no rhonchi. She has  no rales.  Pt speaking in full sentences without difficulty.  Clear lung sounds  Abdominal: Soft. She exhibits no distension and no mass. There is no tenderness. There is no guarding.  Musculoskeletal: Normal range of motion.  Lymphadenopathy:    She has cervical adenopathy.  Neurological: She is alert and oriented to person, place, and time.  Skin: Skin is warm.  Psychiatric: She has a normal mood and affect.  Nursing note and vitals reviewed.    ED Treatments / Results  Labs (all labs ordered are listed, but only abnormal results are displayed) Labs Reviewed - No data to display  EKG None  Radiology No results found.  Procedures Procedures (including critical  care time)  Medications Ordered in ED Medications - No data to display   Initial Impression / Assessment and Plan / ED Course  I have reviewed the triage vital signs and the nursing notes.  Pertinent labs & imaging results that were available during my care of the patient were reviewed by me and considered in my medical decision making (see chart for details).     Patient presenting with 1 day h/o flu like sxs.  Physical exam reassuring, patient is afebrile and appears nontoxic.  She is mildly tachycardic, this is likely due to her virus.  Pulmonary exam reassuring.  Doubt pneumonia, strep, other bacterial infection, or peritonsillar abscess.  Likely viral URI, possibly flu.  Patient requesting Tamiflu.  She is offered flu testing with follow-up via phone, but patient states she would just like a Tamiflu prescription.  She declined strep testing.  Will also treat symptomatically.  Patient to follow-up with primary care as needed.  At this time, patient appears safe for discharge.  Return precautions given.  Patient states she understands and agrees to plan.  Final Clinical Impressions(s) / ED Diagnoses   Final diagnoses:  Flu-like symptoms    ED Discharge Orders        Ordered    oseltamivir (TAMIFLU) 75 MG  capsule  Every 12 hours     03/23/18 1852    ondansetron (ZOFRAN) 4 MG tablet  Every 8 hours PRN     03/23/18 1852    promethazine-dextromethorphan (PROMETHAZINE-DM) 6.25-15 MG/5ML syrup  4 times daily PRN     03/23/18 1852    fluticasone (FLONASE) 50 MCG/ACT nasal spray  Daily     03/23/18 1852       Franchot Heidelberg, PA-C 03/23/18 2012    Fredia Sorrow, MD 03/26/18 [phone]

## 2019-10-27 ENCOUNTER — Other Ambulatory Visit: Payer: Self-pay

## 2019-10-27 ENCOUNTER — Emergency Department (HOSPITAL_COMMUNITY)
Admission: EM | Admit: 2019-10-27 | Discharge: 2019-10-27 | Disposition: A | Discharge status: Home / Self Care | Payer: Medicaid Other | Attending: Emergency Medicine | Admitting: Emergency Medicine

## 2019-10-27 ENCOUNTER — Encounter (HOSPITAL_COMMUNITY): Payer: Self-pay | Admitting: Emergency Medicine

## 2019-10-27 DIAGNOSIS — Z5321 Procedure and treatment not carried out due to patient leaving prior to being seen by health care provider: Secondary | ICD-10-CM | POA: Insufficient documentation

## 2019-10-27 DIAGNOSIS — R0789 Other chest pain: Secondary | ICD-10-CM | POA: Diagnosis present

## 2019-10-27 LAB — BASIC METABOLIC PANEL
Anion gap: 12 (ref 5–15)
BUN: 13 mg/dL (ref 6–20)
CO2: 20 mmol/L — ABNORMAL LOW (ref 22–32)
Calcium: 9.1 mg/dL (ref 8.9–10.3)
Chloride: 108 mmol/L (ref 98–111)
Creatinine, Ser: 0.76 mg/dL (ref 0.44–1.00)
GFR calc Af Amer: >60 mL/min (ref >60)
GFR calc non Af Amer: >60 mL/min (ref >60)
Glucose, Bld: 67 mg/dL — ABNORMAL LOW (ref 70–99)
Potassium: 3.5 mmol/L (ref 3.5–5.1)
Sodium: 140 mmol/L (ref 135–145)

## 2019-10-27 LAB — CBC
HCT: 38.8 % (ref 36.0–46.0)
Hemoglobin: 12.7 g/dL (ref 12.0–15.0)
MCH: 30.7 pg (ref 26.0–34.0)
MCHC: 32.7 g/dL (ref 30.0–36.0)
MCV: 93.7 fL (ref 80.0–100.0)
Platelets: 229 10*3/uL (ref 150–400)
RBC: 4.14 MIL/uL (ref 3.87–5.11)
RDW: 13.5 % (ref 11.5–15.5)
WBC: 5.4 10*3/uL (ref 4.0–10.5)
nRBC: 0 % (ref 0.0–0.2)

## 2019-10-27 LAB — TROPONIN I (HIGH SENSITIVITY): Troponin I (High Sensitivity): <2 ng/L (ref <18)

## 2019-10-27 LAB — I-STAT BETA HCG BLOOD, ED (MC, WL, AP ONLY): I-stat hCG, quantitative: <5 m[IU]/mL (ref <5)

## 2019-10-27 MED ORDER — SODIUM CHLORIDE 0.9% FLUSH: 3.0000 mL | Freq: Once | INTRAVENOUS | Status: DC

## 2019-10-27 NOTE — ED Triage Notes (Signed)
Patient presents to the ED by EMS with c/o sudden onset of severe chest pain in the center of her chest with radiation to the right shoulder and arm. She pulled over to call 911 and goggled the symptoms. She has a family history of MI. ASA was given PTA along with 300 cc of fluids. NAD at tirage and the pt reports she already feels a lot better.

## 2019-10-27 NOTE — ED Notes (Signed)
Unable to locate patient multiple times by staff.  

## 2022-08-23 NOTE — Progress Notes (Signed)
Cardiology Office Note   Date:  08/24/2022   ID:  Peggy [name], Peggy [name] [birth year], MRN [id number]  PCP:  Barnetta Chapel, NP  Cardiologist:   None Referring:  Barnetta Chapel, NP  Chief Complaint  Patient presents with   Chest Pain      History of Present Illness: Peggy [name] is a 41 y.o. female who presents for evaluation of chest pain and palpitations. She was referred by Barnetta Chapel, NP.   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  She did have an echocardiogram in July of this year.  There is some mild tricuspid insufficiency and trivial mitral regurgitation but overall a normal echo.  She did wear a monitor.  I was able to review these results as well and there was no arrhythmias recorded  She has a history apparently of pulmonary embolism reported elsewhere.  I reviewed some notes from 2017 when she was pregnant.  There was a mention of the previous hematology work-up for thrombophilia which was negative but no documentation of this.  She had apparently been managed with Lovenox injections.  She has had opioid dependence treated with medications as below.  She reports that she has been getting some sharp chest discomfort.  This seems to be when she is lying back.  Does not necessarily happen with activity.  She might feel some palpitations as well.  She has a lot of stress she is raising 4 children with her husband gone quite a bit.  She does not do a lot of physical activity but does a lot around the house.  She did push a lawnmower while she was wearing a monitor to see if she would have any arrhythmias.  She did not have any chest discomfort with this.  The discomfort she gets comes on sporadically.  It seems to go over quickly.  She is not describing associated palpitations, presyncope or syncope with that.  She is not having any arm or neck discomfort.  Other than above she is  never had any prior cardiac work-up.  Past Medical History:  Diagnosis Date   Anxiety    Back pain    Fibroid    Pregnancy induced hypertension    Pulmonary embolism (HCC)     Past Surgical History:  Procedure Laterality Date   DILATION AND CURETTAGE OF UTERUS     TONSILLECTOMY       Current Outpatient Medications  Medication Sig Dispense Refill   buprenorphine (SUBUTEX) 8 MG SUBL SL tablet Place 8 mg under the tongue 2 (two) times daily.  0   gabapentin (NEURONTIN) 600 MG tablet Take 600 mg by mouth 3 (three) times daily.     No current facility-administered medications for this visit.    Allergies:   Tramadol and Ibuprofen    Social History:  The patient  reports that she has been smoking cigarettes. She has been smoking an average of .25 packs per day. She has never used smokeless tobacco. She reports current alcohol use. She reports that she does not use drugs.   Family History:  The patient's family history includes Heart disease (age of onset: 63) in her mother.    ROS:  Please see the history of present illness.   Otherwise, review of systems are positive for .   All other systems are reviewed and negative.    PHYSICAL EXAM: VS:  BP 116/74  Pulse 73   Ht '5\' 10"'$  (1.778 m)   Wt 180 lb 12.8 oz (82 kg)   SpO2 100%   BMI 25.94 kg/m  , BMI Body mass index is 25.94 kg/m. GENERAL:  Well appearing HEENT:  Pupils equal round and reactive, fundi not visualized, oral mucosa unremarkable NECK:  No jugular venous distention, waveform within normal limits, carotid upstroke brisk and symmetric, no bruits, no thyromegaly LYMPHATICS:  No cervical, inguinal adenopathy LUNGS:  Clear to auscultation bilaterally BACK:  No CVA tenderness CHEST:  Unremarkable HEART:  PMI not displaced or sustained,S1 and S2 within normal limits, no S3, no S4, no clicks, no rubs, no murmurs ABD:  Flat, positive bowel sounds normal in frequency in pitch, no bruits, no rebound, no guarding, no  midline pulsatile mass, no hepatomegaly, no splenomegaly EXT:  2 plus pulses throughout, no edema, no cyanosis no clubbing SKIN:  No rashes no nodules NEURO:  Cranial nerves II through XII grossly intact, motor grossly intact throughout PSYCH:  Cognitively intact, oriented to person place and time    EKG:  EKG is ordered today. The ekg ordered today demonstrates sinus rhythm, rate 73, axis within normal limits, intervals within normal limits, no acute ST-T wave changes.   Recent Labs: No results found for requested labs within last 365 days.    Lipid Panel No results found for: "CHOL", "TRIG", "HDL", "CHOLHDL", "VLDL", "LDLCALC", "LDLDIRECT"    Wt Readings from Last 3 Encounters:  08/24/22 180 lb 12.8 oz (82 kg)  09/21/16 180 lb (81.6 kg)  05/22/16 200 lb 6 oz (90.9 kg)      Other studies Reviewed: Additional studies/ records that were reviewed today include: Outside echocardiogram result and monitor result and primary care notes. Review of the above records demonstrates:  Please see elsewhere in the note.     ASSESSMENT AND PLAN:  Chest pain: I think this is nonanginal.  She does have significant risk factors and I think be reasonable to do a coronary calcium screen which should be 0.  This will allow for goals of therapy to determine as well.  Tobacco abuse: We discussed this.  She has been unable to quit and I do not think she be a good candidate for Chantix.  Palpitations: There was nothing on her monitor.  She does have an Apple Watch and I encouraged her to use this if she has future symptoms.  Tricuspid regurgitation: This was mild.  We can follow this clinically.   Current medicines are reviewed at length with the patient today.  The patient does not have concerns regarding medicines.  The following changes have been made:  no change  Labs/ tests ordered today include:   Orders Placed This Encounter  Procedures   CT CARDIAC SCORING (SELF PAY ONLY)   Lipid  panel   EKG 12-Lead     Disposition:   FU with me based on the results of the above   Signed, Minus Breeding, MD  08/24/2022 12:03 PM    [place]

## 2022-08-24 ENCOUNTER — Encounter: Payer: Self-pay | Admitting: Cardiology

## 2022-08-24 ENCOUNTER — Ambulatory Visit (INDEPENDENT_AMBULATORY_CARE_PROVIDER_SITE_OTHER): Payer: Medicaid Other | Admitting: Cardiology

## 2022-08-24 VITALS — BP 116/74 | HR 73 | Ht 70.0 in | Wt 180.8 lb

## 2022-08-24 DIAGNOSIS — R002 Palpitations: Secondary | ICD-10-CM

## 2022-08-24 DIAGNOSIS — R079 Chest pain, unspecified: Secondary | ICD-10-CM

## 2022-08-24 NOTE — Patient Instructions (Signed)
Medication Instructions:  Your physician recommends that you continue on your current medications as directed. Please refer to the Current Medication list given to you today.  *If you need a refill on your cardiac medications before your next appointment, please call your pharmacy*   Lab Work: Your physician recommends that you return for lab work: FASTING lipid panel- can be done on same day as Calcium score   If you have labs (blood work) drawn today and your tests are completely normal, you will receive your results only by: [place] (if you have MyChart) OR A paper copy in the mail If you have any lab test that is abnormal or we need to change your treatment, we will call you to review the results.   Testing/Procedures: Dr. Percival Spanish has ordered a CT coronary calcium score.   Test locations:   [facility], 2nd floor)  This is $99 out of pocket.   Coronary CalciumScan A coronary calcium scan is an imaging test used to look for deposits of calcium and other fatty materials (plaques) in the inner lining of the blood vessels of the heart (coronary arteries). These deposits of calcium and plaques can partly clog and narrow the coronary arteries without producing any symptoms or warning signs. This puts a person at risk for a heart attack. This test can detect these deposits before symptoms develop. Tell a health care provider about: Any allergies you have. All medicines you are taking, including vitamins, herbs, eye drops, creams, and over-the-counter medicines. Any problems you or family members have had with anesthetic medicines. Any blood disorders you have. Any surgeries you have had. Any medical conditions you have. Whether you are pregnant or may be pregnant. What are the risks? Generally, this is a safe procedure. However, problems may occur, including: Harm to a pregnant woman and her unborn baby. This test involves the use of  radiation. Radiation exposure can be dangerous to a pregnant woman and her unborn baby. If you are pregnant, you generally should not have this procedure done. Slight increase in the risk of cancer. This is because of the radiation involved in the test. What happens before the procedure? No preparation is needed for this procedure. What happens during the procedure? You will undress and remove any jewelry around your neck or chest. You will put on a hospital gown. Sticky electrodes will be placed on your chest. The electrodes will be connected to an electrocardiogram (ECG) machine to record a tracing of the electrical activity of your heart. A CT scanner will take pictures of your heart. During this time, you will be asked to lie still and hold your breath for 2-3 seconds while a picture of your heart is being taken. The procedure may vary among health care providers and hospitals. What happens after the procedure? You can get dressed. You can return to your normal activities. It is up to you to get the results of your test. Ask your health care provider, or the department that is doing the test, when your results will be ready. Summary A coronary calcium scan is an imaging test used to look for deposits of calcium and other fatty materials (plaques) in the inner lining of the blood vessels of the heart (coronary arteries). Generally, this is a safe procedure. Tell your health care provider if you are pregnant or may be pregnant. No preparation is needed for this procedure. A CT scanner will take pictures of your heart. You can return to  your normal activities after the scan is done. This information is not intended to replace advice given to you by your health care provider. Make sure you discuss any questions you have with your health care provider. Document Released: 06/14/2008 Document Revised: 11/05/2016 Document Reviewed: 11/05/2016 Elsevier Interactive Patient Education  2017 [place]: At [facility], you and your health needs are our priority.  As part of our continuing mission to provide you with exceptional heart care, we have created designated Provider Care Teams.  These Care Teams include your primary Cardiologist (physician) and Advanced Practice Providers (APPs -  Physician Assistants and Nurse Practitioners) who all work together to provide you with the care you need, when you need it.  We recommend signing up for the patient portal called "MyChart".  Sign up information is provided on this After Visit Summary.  MyChart is used to connect with patients for Virtual Visits (Telemedicine).  Patients are able to view lab/test results, encounter notes, upcoming appointments, etc.  Non-urgent messages can be sent to your provider as well.   To learn more about what you can do with MyChart, go to [email or web address].    Your next appointment:   We will see you on an as needed basis.  Provider:   Minus Breeding, MD

## 2022-09-25 ENCOUNTER — Ambulatory Visit (HOSPITAL_BASED_OUTPATIENT_CLINIC_OR_DEPARTMENT_OTHER)
Admission: RE | Admit: 2022-09-25 | Discharge: 2022-09-25 | Disposition: A | Discharge status: Home / Self Care | Payer: Medicaid Other | Source: Ambulatory Visit | Attending: Cardiology | Admitting: Cardiology

## 2022-09-25 DIAGNOSIS — R079 Chest pain, unspecified: Secondary | ICD-10-CM | POA: Insufficient documentation

## 2022-09-25 DIAGNOSIS — R002 Palpitations: Secondary | ICD-10-CM | POA: Insufficient documentation

## 2022-09-27 ENCOUNTER — Telehealth: Payer: Self-pay | Admitting: *Deleted

## 2022-09-27 DIAGNOSIS — R931 Abnormal findings on diagnostic imaging of heart and coronary circulation: Secondary | ICD-10-CM

## 2022-09-27 NOTE — Telephone Encounter (Signed)
Spoke with pt, aware of results ans recommendations. Order placed

## 2022-09-27 NOTE — Telephone Encounter (Signed)
-----   Message from Minus Breeding, MD sent at 09/26/2022  9:43 PM EDT ----- There is very mild coronary calcium.  This is unusual for her age.   I will bring the patient back for a POET (Plain Old Exercise Test). This will allow me to screen for obstructive coronary disease, risk stratify and very importantly provide a prescription for exercise.  Call Ms. Postell with the results and send results to Barnetta Chapel, NP

## 2022-10-17 ENCOUNTER — Encounter: Payer: Self-pay | Admitting: *Deleted

## 2022-10-19 ENCOUNTER — Telehealth (HOSPITAL_COMMUNITY): Payer: Self-pay | Admitting: *Deleted

## 2022-10-19 NOTE — Telephone Encounter (Signed)
Close encounter 

## 2022-10-23 ENCOUNTER — Ambulatory Visit (HOSPITAL_COMMUNITY)
Admission: RE | Admit: 2022-10-23 | Discharge: 2022-10-23 | Disposition: A | Discharge status: Home / Self Care | Payer: Medicaid Other | Source: Ambulatory Visit | Attending: Cardiology | Admitting: Cardiology

## 2022-10-23 DIAGNOSIS — R931 Abnormal findings on diagnostic imaging of heart and coronary circulation: Secondary | ICD-10-CM | POA: Diagnosis present

## 2022-10-23 LAB — EXERCISE TOLERANCE TEST
Angina Index: 0
Duke Treadmill Score: 7
Estimated workload: 7.8
Exercise duration (min): 6 min
Exercise duration (sec): 31 s
MPHR: 181 {beats}/min
Peak HR: 162 {beats}/min
Percent HR: 89 %
Rest HR: 71 {beats}/min
ST Depression (mm): 0 mm

## 2022-10-28 ENCOUNTER — Encounter: Payer: Self-pay | Admitting: Cardiology

## 2023-10-03 ENCOUNTER — Other Ambulatory Visit: Payer: Self-pay | Admitting: Family Medicine

## 2023-10-03 DIAGNOSIS — Z1231 Encounter for screening mammogram for malignant neoplasm of breast: Secondary | ICD-10-CM

## 2023-10-04 ENCOUNTER — Inpatient Hospital Stay: Admission: RE | Admit: 2023-10-04 | Payer: Medicaid Other | Source: Ambulatory Visit
